# Patient Record
Sex: Female | Born: 1990 | Race: Black or African American | Hispanic: No | Marital: Single | Smoking: Never smoker
Health system: Southern US, Community
[De-identification: ages and names within clinical notes are randomized; demographics above are authoritative.]

## PROBLEM LIST (undated history)

## (undated) DIAGNOSIS — Z789 Other specified health status: Secondary | ICD-10-CM

## (undated) DIAGNOSIS — Z8759 Personal history of other complications of pregnancy, childbirth and the puerperium: Secondary | ICD-10-CM

## (undated) HISTORY — DX: Personal history of other complications of pregnancy, childbirth and the puerperium: Z87.59

## (undated) HISTORY — PX: NO PAST SURGERIES: SHX2092

---

## 2017-01-22 ENCOUNTER — Ambulatory Visit (INDEPENDENT_AMBULATORY_CARE_PROVIDER_SITE_OTHER): Payer: No Typology Code available for payment source | Admitting: Obstetrics & Gynecology

## 2017-01-22 ENCOUNTER — Encounter: Payer: Self-pay | Admitting: Obstetrics & Gynecology

## 2017-01-22 VITALS — BP 134/80 | HR 75 | Wt 130.6 lb

## 2017-01-22 DIAGNOSIS — Z113 Encounter for screening for infections with a predominantly sexual mode of transmission: Secondary | ICD-10-CM | POA: Diagnosis not present

## 2017-01-22 DIAGNOSIS — Z23 Encounter for immunization: Secondary | ICD-10-CM

## 2017-01-22 DIAGNOSIS — Z0000 Encounter for general adult medical examination without abnormal findings: Secondary | ICD-10-CM

## 2017-01-22 DIAGNOSIS — N938 Other specified abnormal uterine and vaginal bleeding: Secondary | ICD-10-CM

## 2017-01-22 DIAGNOSIS — Z124 Encounter for screening for malignant neoplasm of cervix: Secondary | ICD-10-CM | POA: Diagnosis not present

## 2017-01-22 NOTE — Progress Notes (Signed)
   Subjective:    Patient ID: Laurie Newman, female    DOB: 1991/03/23, 26 y.o.   MRN: [id number]  HPI 26 yo S AA P0 here today with the issue of DUB. She normally has a period monthly, lasting 6-7 days. However, she has been bleeding for the last 3 weeks.  She has been in a relationship for the past 3 years and didn't use contraception during that time.  She had sex with him about 1 month ago.    Review of Systems She had all 3 Gardasils in the past. 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    Objective:   Physical Exam Breathing, conversing, and ambulating normally Abd- benign Vagina, cervix- normal Bimanual exam- NSSA, NT, no adnexal masses or tenderness     Assessment & Plan:  Preventative care- flu vaccine, pap smear with cervical cultures DUB- check TSH, gyn u/s

## 2017-01-23 LAB — CYTOLOGY - PAP
Chlamydia: POSITIVE — AB
Diagnosis: NEGATIVE
NEISSERIA GONORRHEA: NEGATIVE
TRICH (WINDOWPATH): POSITIVE — AB

## 2017-01-25 ENCOUNTER — Telehealth: Payer: Self-pay

## 2017-01-25 ENCOUNTER — Ambulatory Visit (HOSPITAL_COMMUNITY)
Admission: RE | Admit: 2017-01-25 | Discharge: 2017-01-25 | Disposition: A | Discharge status: Home / Self Care | Payer: No Typology Code available for payment source | Source: Ambulatory Visit | Attending: Obstetrics & Gynecology | Admitting: Obstetrics & Gynecology

## 2017-01-25 ENCOUNTER — Inpatient Hospital Stay (HOSPITAL_COMMUNITY)
Admission: AD | Admit: 2017-01-25 | Discharge: 2017-01-25 | Discharge status: Against Medical Advice | Payer: No Typology Code available for payment source | Source: Ambulatory Visit | Attending: Obstetrics & Gynecology | Admitting: Obstetrics & Gynecology

## 2017-01-25 DIAGNOSIS — D259 Leiomyoma of uterus, unspecified: Secondary | ICD-10-CM | POA: Diagnosis not present

## 2017-01-25 DIAGNOSIS — N938 Other specified abnormal uterine and vaginal bleeding: Secondary | ICD-10-CM | POA: Diagnosis present

## 2017-01-25 MED ORDER — METRONIDAZOLE 500 MG PO TABS: 2000.0000 mg | ORAL_TABLET | Freq: Once | ORAL | 0 refills | Status: AC

## 2017-01-25 MED ORDER — AZITHROMYCIN 250 MG PO TABS: 1000.0000 mg | ORAL_TABLET | Freq: Once | ORAL | 0 refills | Status: AC

## 2017-01-25 NOTE — MAU Note (Signed)
Patient not in lobby. Left prior to triage AMA

## 2017-01-25 NOTE — MAU Note (Signed)
Patient not in lobby

## 2017-01-25 NOTE — Telephone Encounter (Signed)
Notified pt of + Trich and Chlamydia results and the need for Flagyl and Zithromax tx.  Rx e-prescribed per standing protocol.  I also informed pt to please inform her partner(s) as well. Pt stated understanding with no further questions.  STD Card faxed to [facility].

## 2017-01-25 NOTE — MAU Note (Signed)
Not in lobby

## 2017-02-27 ENCOUNTER — Ambulatory Visit: Payer: No Typology Code available for payment source | Admitting: Obstetrics and Gynecology

## 2017-02-27 ENCOUNTER — Encounter: Payer: Self-pay | Admitting: Obstetrics and Gynecology

## 2017-02-27 VITALS — BP 113/67 | HR 72 | Ht 63.0 in | Wt 131.2 lb

## 2017-02-27 DIAGNOSIS — Z09 Encounter for follow-up examination after completed treatment for conditions other than malignant neoplasm: Secondary | ICD-10-CM

## 2017-02-27 NOTE — Progress Notes (Signed)
Patient was diagnosed with STI 4 weeks ago. She took the recommended treatment. Her partner was treated and she has waited the recommended time frame to return to intercourse.  She was told she needed a test of cure. She is here in the office for a test of cure.  Discussed no need for test of cure. Patient is not pregnant. Offered to do [facility] today since the patient is here. Patient declined and was happy she did not have any more testing.    Noni Saupe I, NP 02/27/2017 2:50 PM

## 2018-03-03 DIAGNOSIS — R350 Frequency of micturition: Secondary | ICD-10-CM | POA: Diagnosis not present

## 2018-03-03 DIAGNOSIS — Z202 Contact with and (suspected) exposure to infections with a predominantly sexual mode of transmission: Secondary | ICD-10-CM | POA: Diagnosis not present

## 2018-03-03 DIAGNOSIS — N898 Other specified noninflammatory disorders of vagina: Secondary | ICD-10-CM | POA: Diagnosis not present

## 2019-03-05 ENCOUNTER — Ambulatory Visit: Payer: Self-pay

## 2019-03-10 ENCOUNTER — Other Ambulatory Visit: Payer: Self-pay

## 2019-03-10 ENCOUNTER — Ambulatory Visit (INDEPENDENT_AMBULATORY_CARE_PROVIDER_SITE_OTHER): Payer: Self-pay | Admitting: General Practice

## 2019-03-10 DIAGNOSIS — Z113 Encounter for screening for infections with a predominantly sexual mode of transmission: Secondary | ICD-10-CM

## 2019-03-10 DIAGNOSIS — Z8619 Personal history of other infectious and parasitic diseases: Secondary | ICD-10-CM

## 2019-03-10 DIAGNOSIS — Z3201 Encounter for pregnancy test, result positive: Secondary | ICD-10-CM

## 2019-03-10 LAB — POCT PREGNANCY, URINE: Preg Test, Ur: POSITIVE — AB

## 2019-03-10 NOTE — Progress Notes (Addendum)
Patient presents to office today for UPT. UPT +. Patient reports first positive home test 10/31.  LMP 01/21/19 EDD 10/28/19 [redacted]w[redacted]d. Patient states she takesTylenol PRN but no other meds/vitamins. Advised she start prenatal vitamins & OB care- recommended [facility] office as it is closer to her.  Patient verbalized understanding & requests STD testing today- states she has had a hx of infections in the past, most recently in July. Instructed patient in self swab & specimen collected. Discussed we will reach out to her with any + result/treatment needed & results will be available in mychart. Patient verbalized understanding & had no questions.  Koren Bound RN BSN 03/10/19    Chart reviewed for nurse visit. Agree with plan of care.   Virginia Rochester, NP 03/10/2019 1:09 PM

## 2019-03-11 LAB — CERVICOVAGINAL ANCILLARY ONLY
Chlamydia: NEGATIVE
Comment: NEGATIVE
Comment: NEGATIVE
Comment: NORMAL
Neisseria Gonorrhea: NEGATIVE
Trichomonas: NEGATIVE

## 2019-03-19 ENCOUNTER — Telehealth: Payer: Self-pay | Admitting: *Deleted

## 2019-03-19 DIAGNOSIS — O3680X0 Pregnancy with inconclusive fetal viability, not applicable or unspecified: Secondary | ICD-10-CM

## 2019-03-19 NOTE — Telephone Encounter (Addendum)
Received a Advertising account executive message from Samul Dada at [employer] . They state they saw Laurie Newman and she reported LMP 01/21/19 and had a positive pregnancy test and when they did ultrasound the baby measured on target at 8 weeks but they could not find a heatbeat.  They report they told her that our office would call her back with appointment. Also would like [place] to call them back with if we reached her and the appointment.  Ragan Duhon,RN  I discussed with Dr. Kennon Rounds and will follow protocol and schedule Korea 7 days. I called scheduling line to schedule and no appointment within 7 day time frame per protocol. First available is 12 days. I have called Korea techs to see if they can find anther appointment and left a message for them to call me back.  Vaughan Basta, RN

## 2019-03-19 NOTE — Telephone Encounter (Signed)
Per discussion with Korea techs and scheduling was given appointment for 03/27/19 at 10:00 for viability Korea.  I called Laurie Newman and informed her we got a referral from [facility] because they could not confirm [facility]. I explained we have scheduled [place] for 03/27/19 at 10:00 and then she will come to our office for the results and she may have to wait a bit until we get results from radiologist. I explained we are concerned could be  A miscarriage and that if she has bleeding like a period and /or severe pain to go to [facility] for evaluation for miscarriage or ectopic ; otherwise if she is not having any issues to keep [place] appt for 03/27/19. She also confirmed she is fine now, no pain or bleeding and voices understanding.  I called [facility] and notified them we got there message, have scheduled [place] 03/27/19 10:00 and notified patient.  Braedyn Kauk,RN

## 2019-03-27 ENCOUNTER — Telehealth: Payer: Self-pay | Admitting: *Deleted

## 2019-03-27 ENCOUNTER — Ambulatory Visit (HOSPITAL_COMMUNITY): Admission: RE | Admit: 2019-03-27 | Payer: Self-pay | Source: Ambulatory Visit

## 2019-03-27 ENCOUNTER — Ambulatory Visit: Payer: Self-pay

## 2019-03-27 NOTE — Telephone Encounter (Signed)
Notified by MFM Hinton Dyer changed her viability Korea appointment from 03/27/19 to 04/30/19. Will forward to provider.  Vicenta Olds,RN

## 2019-04-12 ENCOUNTER — Inpatient Hospital Stay (HOSPITAL_COMMUNITY): Payer: Self-pay

## 2019-04-12 ENCOUNTER — Encounter (HOSPITAL_COMMUNITY): Payer: Self-pay | Admitting: Obstetrics and Gynecology

## 2019-04-12 ENCOUNTER — Inpatient Hospital Stay (HOSPITAL_COMMUNITY)
Admission: AD | Admit: 2019-04-12 | Discharge: 2019-04-12 | Disposition: A | Discharge status: Home / Self Care | Payer: Self-pay | Attending: Obstetrics and Gynecology | Admitting: Obstetrics and Gynecology

## 2019-04-12 ENCOUNTER — Other Ambulatory Visit: Payer: Self-pay

## 2019-04-12 DIAGNOSIS — N939 Abnormal uterine and vaginal bleeding, unspecified: Secondary | ICD-10-CM

## 2019-04-12 DIAGNOSIS — O039 Complete or unspecified spontaneous abortion without complication: Secondary | ICD-10-CM | POA: Insufficient documentation

## 2019-04-12 HISTORY — DX: Other specified health status: Z78.9

## 2019-04-12 LAB — CBC
HCT: 39.2 % (ref 36.0–46.0)
Hemoglobin: 13.7 g/dL (ref 12.0–15.0)
MCH: 31.7 pg (ref 26.0–34.0)
MCHC: 34.9 g/dL (ref 30.0–36.0)
MCV: 90.7 fL (ref 80.0–100.0)
Platelets: 325 10*3/uL (ref 150–400)
RBC: 4.32 MIL/uL (ref 3.87–5.11)
RDW: 12.2 % (ref 11.5–15.5)
WBC: 5.8 10*3/uL (ref 4.0–10.5)
nRBC: 0 % (ref 0.0–0.2)

## 2019-04-12 LAB — URINALYSIS, ROUTINE W REFLEX MICROSCOPIC
Bilirubin Urine: NEGATIVE
Glucose, UA: NEGATIVE mg/dL
Hgb urine dipstick: NEGATIVE
Ketones, ur: NEGATIVE mg/dL
Leukocytes,Ua: NEGATIVE
Nitrite: NEGATIVE
Protein, ur: NEGATIVE mg/dL
Specific Gravity, Urine: 1.018 (ref 1.005–1.030)
pH: 6 (ref 5.0–8.0)

## 2019-04-12 LAB — WET PREP, GENITAL
Clue Cells Wet Prep HPF POC: NONE SEEN
Sperm: NONE SEEN
Trich, Wet Prep: NONE SEEN
Yeast Wet Prep HPF POC: NONE SEEN

## 2019-04-12 LAB — ABO/RH: ABO/RH(D): B POS

## 2019-04-12 MED ORDER — MISOPROSTOL 200 MCG PO TABS: 800.0000 ug | ORAL_TABLET | Freq: Four times a day (QID) | ORAL | 0 refills | Status: DC

## 2019-04-12 MED ORDER — ONDANSETRON 8 MG PO TBDP: 8.0000 mg | ORAL_TABLET | Freq: Three times a day (TID) | ORAL | 0 refills | Status: DC | PRN

## 2019-04-12 MED ORDER — IBUPROFEN 400 MG PO TABS: 400.0000 mg | ORAL_TABLET | Freq: Four times a day (QID) | ORAL | 0 refills | Status: DC | PRN

## 2019-04-12 MED ORDER — ACETAMINOPHEN-CODEINE #3 300-30 MG PO TABS: 1.0000 | ORAL_TABLET | ORAL | 0 refills | Status: DC | PRN

## 2019-04-12 MED ORDER — MISOPROSTOL 200 MCG PO TABS
800.0000 ug | ORAL_TABLET | Freq: Once | ORAL | Status: AC
  Administered 2019-04-12: 800 ug via ORAL
  Filled 2019-04-12: qty 4

## 2019-04-12 NOTE — MAU Provider Note (Addendum)
Patient Laurie Newman is a 28 y.o. G1P0000  at [redacted]w[redacted]d here with complaints of vaginal bleeding last night and this morning. She denies constipation, vomiting, nausea, fever, SOB, dysuria or other ob-gyn complaints.   She had an Korea at 8 weeks at pregnancy care center on 11/23, but they were unable to confirm viable pregnancy. She was scheduled for follow up US on 12/3 at [facility] but she canceled the appointment and rescheduled it for early January. History     CSN: [id number]  Arrival date and time: 04/12/19 1028   None     Chief Complaint  Patient presents with  . Abdominal Pain  . Vaginal Bleeding   Vaginal Bleeding The patient's primary symptoms include vaginal bleeding. This is a new problem. The current episode started yesterday. The problem occurs intermittently. The problem has been unchanged. The patient is experiencing no pain. She is pregnant. Pertinent negatives include no abdominal pain, constipation, diarrhea or urgency. The vaginal discharge was bloody and mucoid. The vaginal bleeding is spotting. She has not been passing clots. She has not been passing tissue.  She noticed some mixed pink and brown blood on her underwear last night and this morning. It is not brisk bleeding; she notices it when she wipes.   OB History    Gravida  1   Para  0   Term  0   Preterm  0   AB  0   Living  0     SAB  0   TAB  0   Ectopic  0   Multiple  0   Live Births  0           Past Medical History:  Diagnosis Date  . Medical history non-contributory     Past Surgical History:  Procedure Laterality Date  . NO PAST SURGERIES      History reviewed. No pertinent family history.  Social History   Tobacco Use  . Smoking status: Never Smoker  . Smokeless tobacco: Never Used  Substance Use Topics  . Alcohol use: Not Currently  . Drug use: Not Currently    Allergies: No Known Allergies  Medications Prior to Admission  Medication Sig Dispense Refill Last Dose   . naproxen sodium (ANAPROX) 220 MG tablet Take 220 mg by mouth as needed.       Review of Systems  Constitutional: Negative.   HENT: Negative.   Gastrointestinal: Negative for abdominal pain, constipation and diarrhea.  Genitourinary: Positive for vaginal bleeding. Negative for urgency.  Neurological: Negative.   Hematological: Negative.    Physical Exam   Blood pressure 125/88, pulse 91, temperature 98.3 F (36.8 C), temperature source Oral, resp. rate 16, height 5\' 3"  (1.6 m), weight 64 kg, last menstrual period 01/21/2019, SpO2 100 %.  Physical Exam  Constitutional: She appears well-developed.  HENT:  Head: Normocephalic.  Respiratory: Effort normal.  GI: Soft.  Genitourinary:    Vagina normal.     Genitourinary Comments: NEFG; trace amounts of brown blood in the vagina, no clots or tissues. No lesions on vaginal walls or cervix.  Cervix is closed, long and thick, no CMT, suprapubic or adnexal tenderness.    Musculoskeletal:        General: Normal range of motion.     Cervical back: Normal range of motion.  Neurological: She is alert.  Skin: Skin is warm.  Psychiatric: She has a normal mood and affect.    MAU Course  Procedures  MDM -Korea for viability  shows embryo at 8 weeks with no cardiac activity; definitive for failed pregnancy  -Blood Type is B pos -CBC is 13.7 -wet prep negative  -GC CT pending     Early Intrauterine Pregnancy Failure Protocol X  Documented intrauterine pregnancy failure less than or equal to [redacted] weeks   gestation  X  No serious current illness  X  Baseline Hgb greater than or equal to 10g/dl  X  Patient has easily accessible transportation to the hospital  X  Clear preference  X  Practitioner/physician deems patient reliable  X  Counseling by practitioner or physician  X  Patient education by RN  X  Consent form signed       Rho-Gam given by RN if indicated  X  Medication dispensed  X  Cytotec 800 mcg Buccally by RN at home        Intravaginally by NP in MAU       Rectally by patient at home       Rectally by RN in MAU  X   Ibuprofen 600 mg 1 tablet by mouth every 6 hours as needed #30 - prescribed  X   Tylenol #3 mg by mouth every 4 to 6 hours as needed - prescribed  X   Zofran 8 mg by mouth every  as needed for nausea - prescribed  Reviewed with pt cytotec procedure.  Pt verbalizes that she lives close to the hospital and has transportation readily available.  Pt appears reliable and verbalizes understanding and agrees with plan of care  [place] images reviewed by me.   Assessment and Plan   1. Miscarriage   2. Vaginal bleeding     -Patient tolerated cytotec buccally well -Rx for pain and anti-nausea medicine given -message sent to clinic to change patient's appt on the 6th to a follow up from SAB.  -Reviewed strict bleeding and infections precautions and when to return to MAU.  -patient to take second dose of cytotec if she does not start bleeding within 72 hours.  -Work note given for patient to return to work on Thursday [employer] 04/12/2019, 11:34 AM

## 2019-04-12 NOTE — MAU Note (Signed)
Laurie Newman is a 28 y.o. at [redacted]w[redacted]d here in MAU reporting: [facility] around 8 weeks and they did an u/s but did not see any cardiac activity. Last night started having some pink spotting, only saw it on the toilet paper. This AM saw some brown bleeding, still only on the toilet paper after she wiped but it was more then the previous pink bleeding. Having some lower abdominal discomfort.  Onset of complaint: yesterday  Pain score: 2/10  Vitals:   04/12/19 1044  BP: 125/88  Pulse: 91  Resp: 16  Temp: 98.3 F (36.8 C)  SpO2: 100%     Lab orders placed from triage: UA

## 2019-04-12 NOTE — Discharge Instructions (Signed)
Miscarriage A miscarriage is the loss of an unborn baby (fetus) before the 20th week of pregnancy. Follow these instructions at home: Medicines   Take over-the-counter and prescription medicines only as told by your doctor.  If you were prescribed antibiotic medicine, take it as told by your doctor. Do not stop taking the antibiotic even if you start to feel better.  Do not take NSAIDs unless your doctor says that this is safe for you. NSAIDs include aspirin and ibuprofen. These medicines can cause bleeding. Activity  Rest as directed. Ask your doctor what activities are safe for you.  Have someone help you at home during this time. General instructions  Write down how many pads you use each day and how soaked they are.  Watch the amount of tissue or clumps of blood (blood clots) that you pass from your vagina. Save any large amounts of tissue for your doctor.  Do not use tampons, douche, or have sex until your doctor approves.  To help you and your partner with the process of grieving, talk with your doctor or seek counseling.  When you are ready, meet with your doctor to talk about steps you should take for your health. Also, talk with your doctor about steps to take to have a healthy pregnancy in the future.  Keep all follow-up visits as told by your doctor. This is important. Contact a doctor if:  You have a fever or chills.  You have vaginal discharge that smells bad.  You have more bleeding. Get help right away if:  You have very bad cramps or pain in your back or belly.  You pass clumps of blood that are walnut-sized or larger from your vagina.  You pass tissue that is walnut-sized or larger from your vagina.  You soak more than 1 regular pad in an hour.  You get light-headed or weak.  You faint (pass out).  You have feelings of sadness that do not go away, or you have thoughts of hurting yourself. Summary  A miscarriage is the loss of an unborn baby before  the 20th week of pregnancy.  Follow your doctor's instructions for home care. Keep all follow-up appointments.  To help you and your partner with the process of grieving, talk with your doctor or seek counseling. This information is not intended to replace advice given to you by your health care provider. Make sure you discuss any questions you have with your health care provider. Document Released: 07/03/2011 Document Revised: 08/02/2018 Document Reviewed: 05/16/2016 Elsevier Patient Education  2020 [employer].

## 2019-04-14 LAB — GC/CHLAMYDIA PROBE AMP (~~LOC~~) NOT AT ARMC
Chlamydia: NEGATIVE
Comment: NEGATIVE
Comment: NORMAL
Neisseria Gonorrhea: NEGATIVE

## 2019-04-15 ENCOUNTER — Other Ambulatory Visit: Payer: Self-pay

## 2019-04-30 ENCOUNTER — Other Ambulatory Visit: Payer: Self-pay

## 2019-04-30 ENCOUNTER — Encounter: Payer: Self-pay | Admitting: Medical

## 2019-04-30 ENCOUNTER — Ambulatory Visit (INDEPENDENT_AMBULATORY_CARE_PROVIDER_SITE_OTHER): Payer: BC Managed Care – PPO | Admitting: Medical

## 2019-04-30 ENCOUNTER — Other Ambulatory Visit (HOSPITAL_COMMUNITY): Payer: Self-pay

## 2019-04-30 ENCOUNTER — Ambulatory Visit: Payer: Self-pay

## 2019-04-30 VITALS — BP 124/83 | HR 98 | Ht 63.0 in | Wt 144.1 lb

## 2019-04-30 DIAGNOSIS — O039 Complete or unspecified spontaneous abortion without complication: Secondary | ICD-10-CM | POA: Diagnosis not present

## 2019-04-30 NOTE — Patient Instructions (Signed)
Miscarriage A miscarriage is the loss of an unborn baby (fetus) before the 20th week of pregnancy. Follow these instructions at home: Medicines   Take over-the-counter and prescription medicines only as told by your doctor.  If you were prescribed antibiotic medicine, take it as told by your doctor. Do not stop taking the antibiotic even if you start to feel better.  Do not take NSAIDs unless your doctor says that this is safe for you. NSAIDs include aspirin and ibuprofen. These medicines can cause bleeding. Activity  Rest as directed. Ask your doctor what activities are safe for you.  Have someone help you at home during this time. General instructions  Write down how many pads you use each day and how soaked they are.  Watch the amount of tissue or clumps of blood (blood clots) that you pass from your vagina. Save any large amounts of tissue for your doctor.  Do not use tampons, douche, or have sex until your doctor approves.  To help you and your partner with the process of grieving, talk with your doctor or seek counseling.  When you are ready, meet with your doctor to talk about steps you should take for your health. Also, talk with your doctor about steps to take to have a healthy pregnancy in the future.  Keep all follow-up visits as told by your doctor. This is important. Contact a doctor if:  You have a fever or chills.  You have vaginal discharge that smells bad.  You have more bleeding. Get help right away if:  You have very bad cramps or pain in your back or belly.  You pass clumps of blood that are walnut-sized or larger from your vagina.  You pass tissue that is walnut-sized or larger from your vagina.  You soak more than 1 regular pad in an hour.  You get light-headed or weak.  You faint (pass out).  You have feelings of sadness that do not go away, or you have thoughts of hurting yourself. Summary  A miscarriage is the loss of an unborn baby before  the 20th week of pregnancy.  Follow your doctor's instructions for home care. Keep all follow-up appointments.  To help you and your partner with the process of grieving, talk with your doctor or seek counseling. This information is not intended to replace advice given to you by your health care provider. Make sure you discuss any questions you have with your health care provider. Document Revised: 08/02/2018 Document Reviewed: 05/16/2016 Elsevier Patient Education  2020 Elsevier Inc.  

## 2019-04-30 NOTE — Progress Notes (Signed)
cytotec 12/19 Bled x 2 weeks No bleeding  No pain  No fever   Cbc hcg   History:  [name] is a 29 y.o. G1P0010 who presents to clinic today for SAB follow-up. The patient was seen in the MAU on 12/19 and diagnosed with missed AB at ~ [redacted] weeks GA. She was given Cytotec. She had bleeding for ~ 2 weeks. She denies bleeding, pain or fever today.    The following portions of the patient's history were reviewed and updated as appropriate: allergies, current medications, family history, past medical history, social history, past surgical history and problem list.  Review of Systems:  Review of Systems  Constitutional: Negative for fever and malaise/fatigue.  Gastrointestinal: Negative for abdominal pain.  Genitourinary: Negative for dysuria, frequency and urgency.       Neg - vaginal bleeding      Objective:  Physical Exam BP 124/83   Pulse 98   Ht 5\' 3"  (1.6 m)   Wt 144 lb 1.6 oz (65.4 kg)   LMP 01/21/2019 Comment: SAB  Breastfeeding Unknown   BMI 25.53 kg/m  Physical Exam  Nursing note and vitals reviewed. Constitutional: She is oriented to person, place, and time. She appears well-developed and well-nourished. No distress.  HENT:  Head: Normocephalic and atraumatic.  Cardiovascular: Normal rate.  Respiratory: Effort normal.  GI: Soft. She exhibits no distension.  Neurological: She is alert and oriented to person, place, and time.  Skin: Skin is warm and dry. No erythema.  Psychiatric: She has a normal mood and affect.    Assessment & Plan:  1. Miscarriage - CBC - Beta hCG quant (ref lab) - Patient desired pregnancy. Discussed appropriate waiting period for attempting to conceive.  - Patient will be contacted with results through [facility]  Luvenia Redden, PA-C 04/30/2019 4:21 PM

## 2019-05-01 LAB — CBC
Hematocrit: 42.6 % (ref 34.0–46.6)
Hemoglobin: 14.4 g/dL (ref 11.1–15.9)
MCH: 31.1 pg (ref 26.6–33.0)
MCHC: 33.8 g/dL (ref 31.5–35.7)
MCV: 92 fL (ref 79–97)
Platelets: 409 10*3/uL (ref 150–450)
RBC: 4.63 x10E6/uL (ref 3.77–5.28)
RDW: 12.2 % (ref 11.7–15.4)
WBC: 6 10*3/uL (ref 3.4–10.8)

## 2019-05-01 LAB — BETA HCG QUANT (REF LAB): hCG Quant: 25 m[IU]/mL

## 2019-05-08 ENCOUNTER — Other Ambulatory Visit: Payer: BC Managed Care – PPO

## 2019-07-09 DIAGNOSIS — O99211 Obesity complicating pregnancy, first trimester: Secondary | ICD-10-CM | POA: Diagnosis not present

## 2019-07-09 DIAGNOSIS — Z113 Encounter for screening for infections with a predominantly sexual mode of transmission: Secondary | ICD-10-CM | POA: Diagnosis not present

## 2019-07-09 DIAGNOSIS — D251 Intramural leiomyoma of uterus: Secondary | ICD-10-CM | POA: Diagnosis not present

## 2019-07-09 DIAGNOSIS — Z3687 Encounter for antenatal screening for uncertain dates: Secondary | ICD-10-CM | POA: Diagnosis not present

## 2019-07-09 DIAGNOSIS — O468X1 Other antepartum hemorrhage, first trimester: Secondary | ICD-10-CM | POA: Diagnosis not present

## 2019-07-09 DIAGNOSIS — O3680X0 Pregnancy with inconclusive fetal viability, not applicable or unspecified: Secondary | ICD-10-CM | POA: Diagnosis not present

## 2019-07-09 DIAGNOSIS — Z3401 Encounter for supervision of normal first pregnancy, first trimester: Secondary | ICD-10-CM | POA: Diagnosis not present

## 2019-07-21 DIAGNOSIS — O3680X0 Pregnancy with inconclusive fetal viability, not applicable or unspecified: Secondary | ICD-10-CM | POA: Diagnosis not present

## 2019-07-21 DIAGNOSIS — O468X1 Other antepartum hemorrhage, first trimester: Secondary | ICD-10-CM | POA: Diagnosis not present

## 2019-07-21 DIAGNOSIS — O209 Hemorrhage in early pregnancy, unspecified: Secondary | ICD-10-CM | POA: Diagnosis not present

## 2019-07-21 DIAGNOSIS — Z3A10 10 weeks gestation of pregnancy: Secondary | ICD-10-CM | POA: Diagnosis not present

## 2019-08-08 DIAGNOSIS — O468X1 Other antepartum hemorrhage, first trimester: Secondary | ICD-10-CM | POA: Diagnosis not present

## 2019-08-08 DIAGNOSIS — Z1379 Encounter for other screening for genetic and chromosomal anomalies: Secondary | ICD-10-CM | POA: Diagnosis not present

## 2019-08-08 DIAGNOSIS — Z36 Encounter for antenatal screening for chromosomal anomalies: Secondary | ICD-10-CM | POA: Diagnosis not present

## 2019-08-08 DIAGNOSIS — Z3682 Encounter for antenatal screening for nuchal translucency: Secondary | ICD-10-CM | POA: Diagnosis not present

## 2019-08-08 DIAGNOSIS — D259 Leiomyoma of uterus, unspecified: Secondary | ICD-10-CM | POA: Diagnosis not present

## 2019-08-08 DIAGNOSIS — Z3A13 13 weeks gestation of pregnancy: Secondary | ICD-10-CM | POA: Diagnosis not present

## 2019-09-24 DIAGNOSIS — O99212 Obesity complicating pregnancy, second trimester: Secondary | ICD-10-CM | POA: Diagnosis not present

## 2019-09-24 DIAGNOSIS — Z3686 Encounter for antenatal screening for cervical length: Secondary | ICD-10-CM | POA: Diagnosis not present

## 2019-09-24 DIAGNOSIS — Z3A20 20 weeks gestation of pregnancy: Secondary | ICD-10-CM | POA: Diagnosis not present

## 2019-10-10 DIAGNOSIS — Z362 Encounter for other antenatal screening follow-up: Secondary | ICD-10-CM | POA: Diagnosis not present

## 2019-10-10 DIAGNOSIS — O99212 Obesity complicating pregnancy, second trimester: Secondary | ICD-10-CM | POA: Diagnosis not present

## 2019-10-10 DIAGNOSIS — Z3A22 22 weeks gestation of pregnancy: Secondary | ICD-10-CM | POA: Diagnosis not present

## 2021-08-10 IMAGING — US US OB COMP LESS 14 WK
1 series · 15 of 28 positions shown · non-contrast
Comparison: None of this gestation

CLINICAL DATA: Vaginal bleeding and first-trimester pregnancy

EXAM:
OBSTETRIC <14 WK ULTRASOUND
TECHNIQUE: Transabdominal ultrasound was performed for evaluation of the
gestation as well as the maternal uterus and adnexal regions.

[Series 1: us ob comp less 14 wk · 15 of 46 slices shown]
[im 1/46]
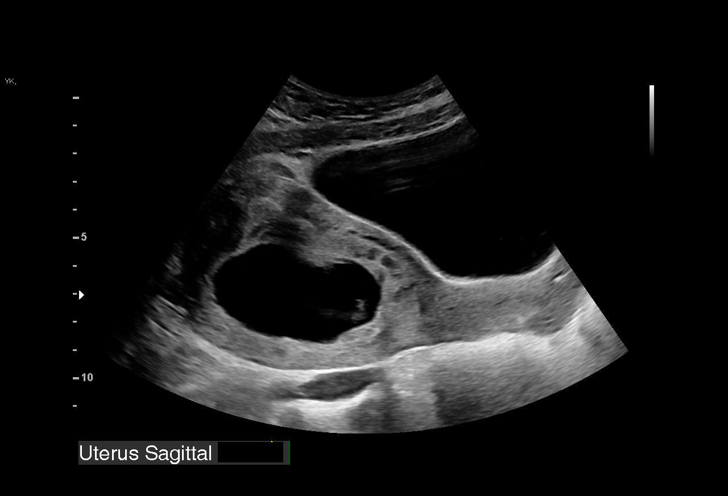
[im 4/46]
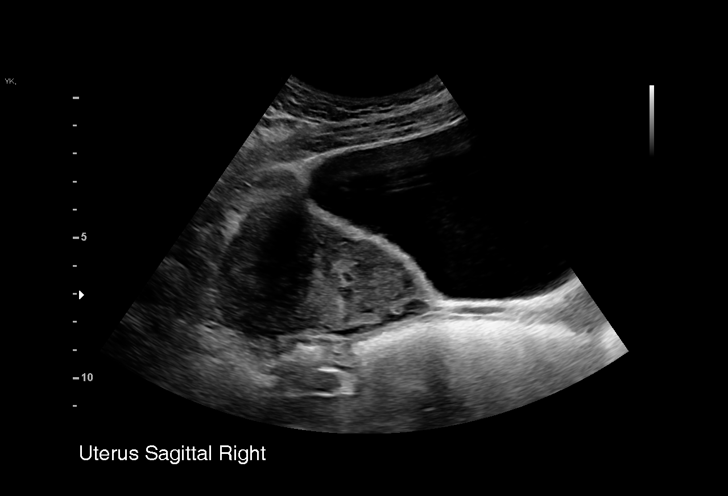
[im 7/46]
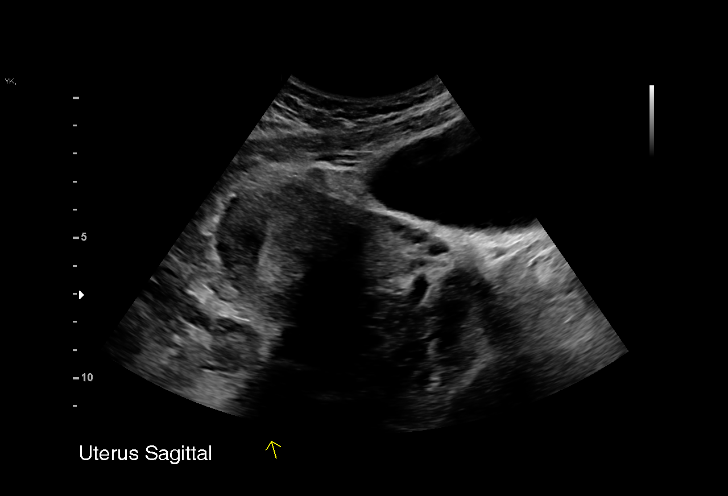
[im 11/46]
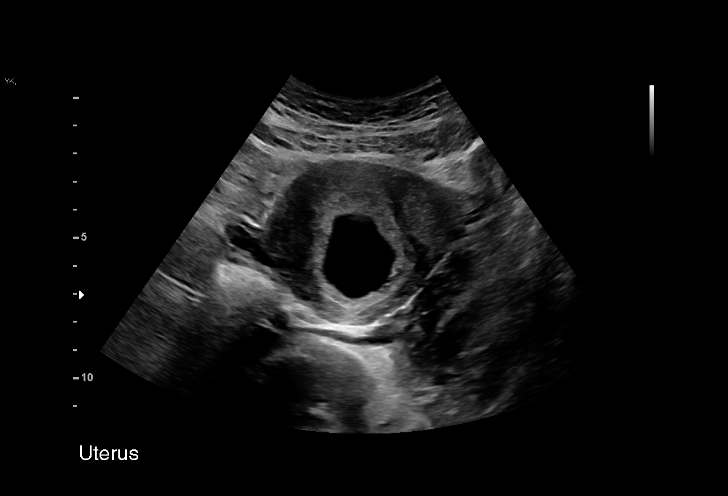
[im 14/46]
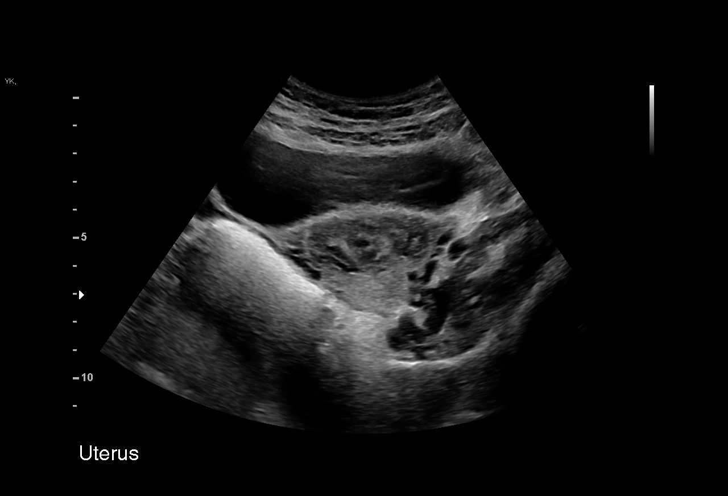
[im 17/46]
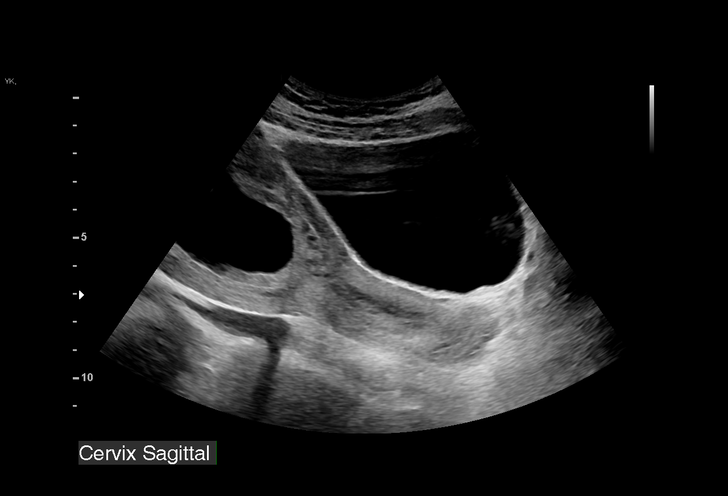
[im 21/46]
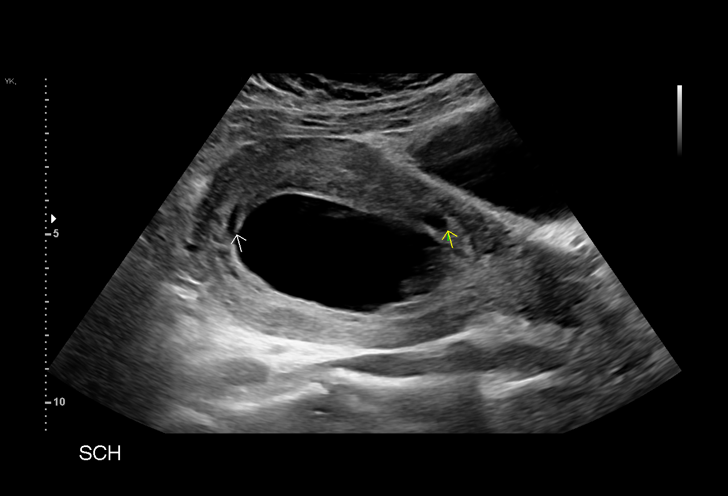
[im 24/46]
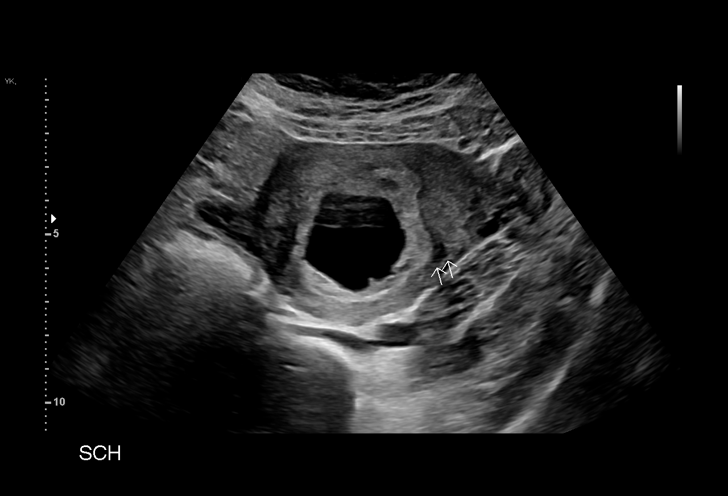
[im 26/46]
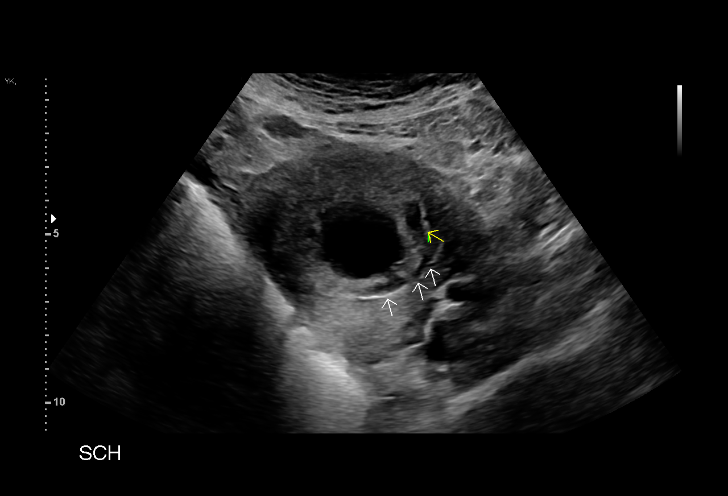
[im 29/46]
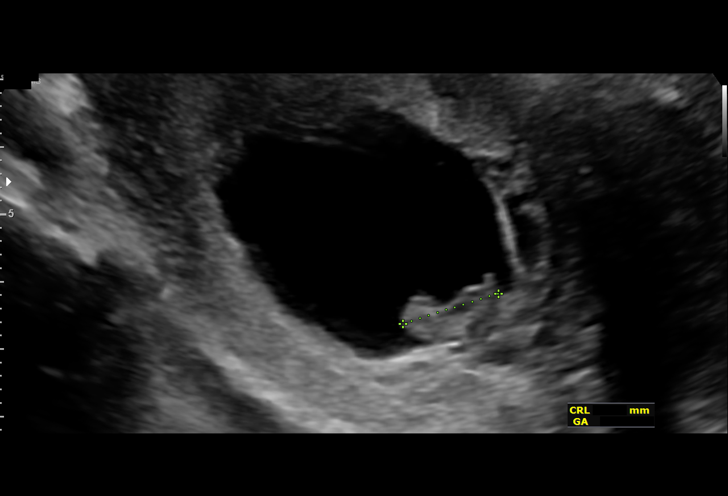
[im 32/46]
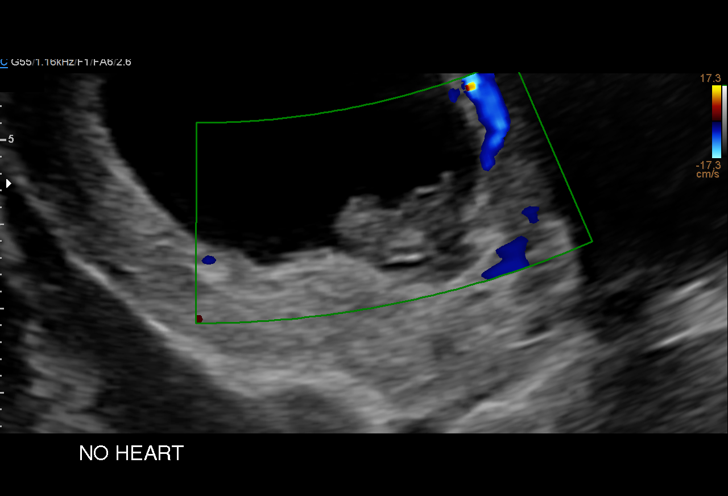
[im 36/46]
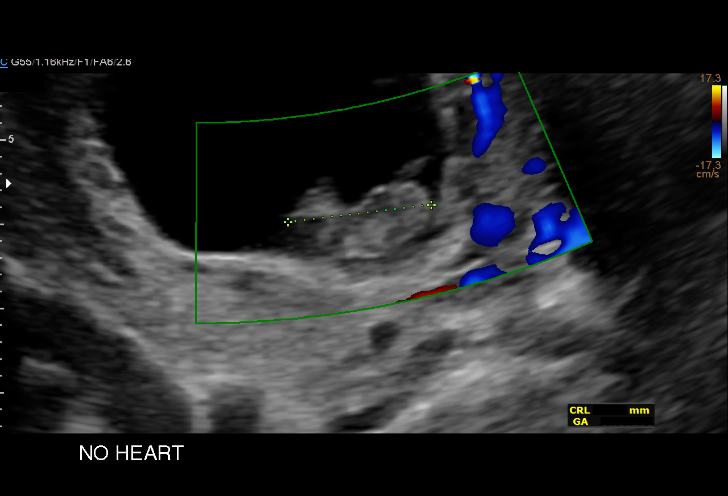
[im 39/46]
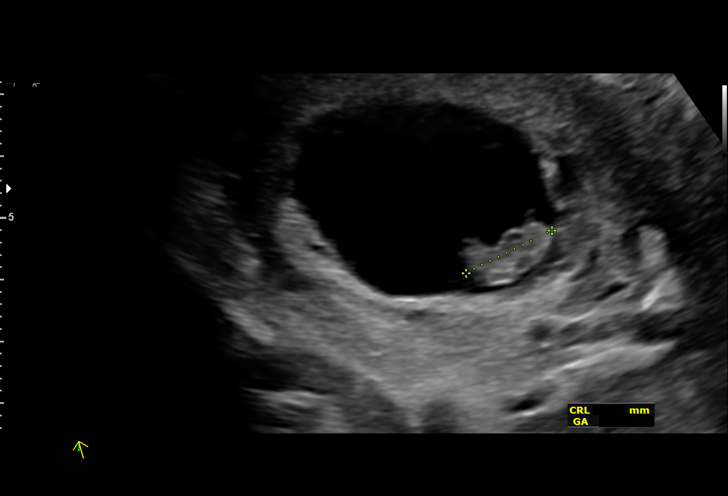
[im 42/46]
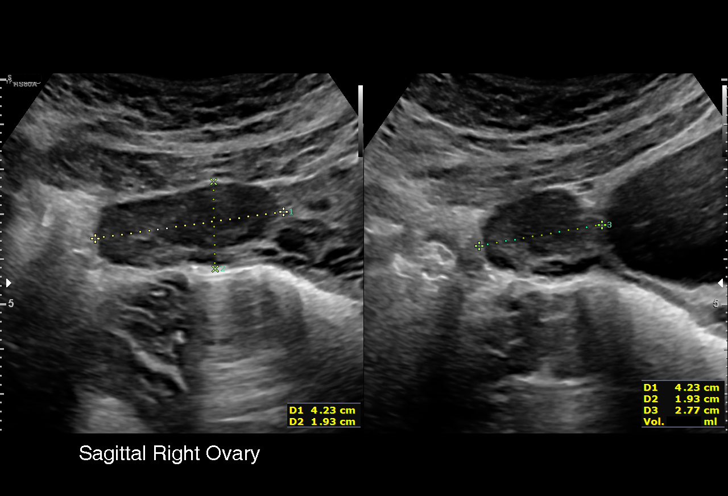
[im 46/46]
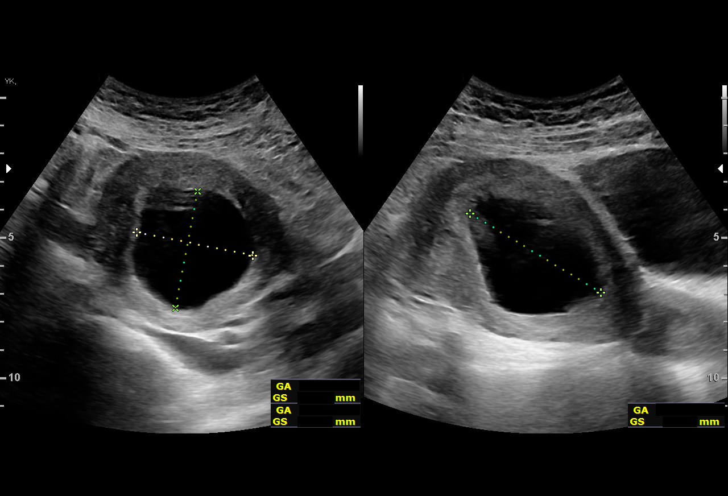

[15 of 28 positions shown; findings below may reference images not displayed]

FINDINGS: Intrauterine gestational sac: Present and disproportionately large
relative to the crown-rump length

Yolk sac:  Not seen

Embryo:  Present

Cardiac Activity: Not seen

CRL:   16.3 mm   8 w 0 d

Subchorionic hemorrhage: Small crescentic pockets of hemorrhage are
seen at multiple locations along the gestational sac.

Maternal uterus/adnexae: Small fundal fibroid on a 4625 comparison
which was re-identified, 2.6 cm.
IMPRESSION: 1. Single intrauterine pregnancy with crown lump length of 16.3 mm
and no cardiac activity. Findings meet definitive criteria for
failed pregnancy. This follows SRU consensus guidelines: Diagnostic
Criteria for Nonviable Pregnancy Early in the First Trimester. N
Engl J Med 3300;[DATE].
2. Multiple small pockets of subchorionic hemorrhage around the
gestational sac.

## 2023-09-04 ENCOUNTER — Other Ambulatory Visit (HOSPITAL_COMMUNITY)
Admission: RE | Admit: 2023-09-04 | Discharge: 2023-09-04 | Disposition: A | Discharge status: Home / Self Care | Source: Ambulatory Visit | Attending: Obstetrics & Gynecology | Admitting: Obstetrics & Gynecology

## 2023-09-04 ENCOUNTER — Encounter (HOSPITAL_BASED_OUTPATIENT_CLINIC_OR_DEPARTMENT_OTHER): Payer: Self-pay

## 2023-09-04 ENCOUNTER — Ambulatory Visit (HOSPITAL_BASED_OUTPATIENT_CLINIC_OR_DEPARTMENT_OTHER)

## 2023-09-04 VITALS — BP 113/72 | HR 97 | Ht 63.0 in | Wt 216.0 lb

## 2023-09-04 DIAGNOSIS — Z6838 Body mass index (BMI) 38.0-38.9, adult: Secondary | ICD-10-CM

## 2023-09-04 DIAGNOSIS — Z3481 Encounter for supervision of other normal pregnancy, first trimester: Secondary | ICD-10-CM | POA: Diagnosis present

## 2023-09-04 DIAGNOSIS — Z3A11 11 weeks gestation of pregnancy: Secondary | ICD-10-CM | POA: Diagnosis not present

## 2023-09-04 DIAGNOSIS — O0993 Supervision of high risk pregnancy, unspecified, third trimester: Secondary | ICD-10-CM | POA: Insufficient documentation

## 2023-09-04 DIAGNOSIS — Z3480 Encounter for supervision of other normal pregnancy, unspecified trimester: Secondary | ICD-10-CM | POA: Insufficient documentation

## 2023-09-04 LAB — HEPATITIS C ANTIBODY: HCV Ab: NEGATIVE

## 2023-09-04 MED ORDER — GOJJI WEIGHT SCALE MISC: 1.0000 | Freq: Once | 0 refills | Status: AC

## 2023-09-04 MED ORDER — BLOOD PRESSURE KIT DEVI: 1.0000 | Freq: Once | 0 refills | Status: AC

## 2023-09-04 NOTE — Progress Notes (Unsigned)
 New OB Intake  I explained I am completing New OB Intake today. We discussed EDD of 03/23/2024, by Last Menstrual Period. Pt is G4P1021. I reviewed her allergies, medications and Medical/Surgical/OB history.    There are no active problems to display for this patient.    Concerns addressed today  Delivery Plans Plans to deliver at [facility]. Discussed the nature of our practice with multiple providers including residents and students. Due to the size of the practice, the delivering provider may not be the same as those providing prenatal care.   Patient is interested in water birth.  MyChart/Babyscripts MyChart access verified. I explained pt will have some visits in office and some virtually. Babyscripts instructions given and order placed. Patient verifies receipt of registration text/e-mail.   Blood Pressure Cuff/Weight Scale Blood pressure cuff ordered for patient to pick-up from [employer]. Explained after first prenatal appt pt will check weekly and document in Babyscripts. Patient does not have weight scale; order sent to [facility], patient may track weight weekly in Babyscripts.  Anatomy US  Explained first scheduled US  will be around 19 weeks. Anatomy US  scheduled for *** at ***.  Is patient a CenteringPregnancy candidate?  Declined Declined due to Schedule    Is patient a Mom+Baby Combined Care candidate?  Not a candidate   If accepted, confirm patient does not intend to move from the area for at least 12 months, then notify Mom+Baby staff    First visit review I reviewed new OB appt with patient. Explained pt will be seen by Dr.Miller at first visit. Discussed [facility] [place] genetic screening with patient. Pt declines both Panorama and Horizon.. Routine prenatal labs collected   Last Pap Diagnosis  Date Value Ref Range Status  01/22/2017   Final   NEGATIVE FOR INTRAEPITHELIAL LESIONS OR MALIGNANCY.  01/22/2017 TRICHOMONAS VAGINALIS PRESENT.  Final    Marliss Simple, RN 09/04/2023  3:30 PM

## 2023-09-05 DIAGNOSIS — Z6838 Body mass index (BMI) 38.0-38.9, adult: Secondary | ICD-10-CM | POA: Insufficient documentation

## 2023-09-06 LAB — CBC
Hematocrit: 40.9 % (ref 34.0–46.6)
Hemoglobin: 13.8 g/dL (ref 11.1–15.9)
MCH: 30.9 pg (ref 26.6–33.0)
MCHC: 33.7 g/dL (ref 31.5–35.7)
MCV: 92 fL (ref 79–97)
Platelets: 329 10*3/uL (ref 150–450)
RBC: 4.47 x10E6/uL (ref 3.77–5.28)
RDW: 11.7 % (ref 11.7–15.4)
WBC: 8.7 10*3/uL (ref 3.4–10.8)

## 2023-09-06 LAB — CERVICOVAGINAL ANCILLARY ONLY
Chlamydia: NEGATIVE
Comment: NEGATIVE
Comment: NORMAL
Neisseria Gonorrhea: NEGATIVE

## 2023-09-06 LAB — HEMOGLOBIN A1C
Est. average glucose Bld gHb Est-mCnc: 105 mg/dL
Hgb A1c MFr Bld: 5.3 % (ref 4.8–5.6)

## 2023-09-06 LAB — HIV ANTIBODY (ROUTINE TESTING W REFLEX): HIV Screen 4th Generation wRfx: NONREACTIVE

## 2023-09-06 LAB — ABO/RH: Rh Factor: POSITIVE

## 2023-09-06 LAB — HEPATITIS C ANTIBODY: Hep C Virus Ab: NONREACTIVE

## 2023-09-06 LAB — HEPATITIS B SURFACE ANTIGEN: Hepatitis B Surface Ag: NEGATIVE

## 2023-09-06 LAB — ANTIBODY SCREEN: Antibody Screen: NEGATIVE

## 2023-09-06 LAB — RPR: RPR Ser Ql: NONREACTIVE

## 2023-09-06 LAB — RUBELLA SCREEN: Rubella Antibodies, IGG: 3.88 {index} (ref >0.99)

## 2023-09-07 LAB — URINE CULTURE

## 2023-09-11 ENCOUNTER — Ambulatory Visit (INDEPENDENT_AMBULATORY_CARE_PROVIDER_SITE_OTHER): Admitting: Certified Nurse Midwife

## 2023-09-11 ENCOUNTER — Encounter (HOSPITAL_BASED_OUTPATIENT_CLINIC_OR_DEPARTMENT_OTHER): Payer: Self-pay | Admitting: Certified Nurse Midwife

## 2023-09-11 VITALS — BP 124/62 | HR 100 | Wt 216.8 lb

## 2023-09-11 DIAGNOSIS — Z3481 Encounter for supervision of other normal pregnancy, first trimester: Secondary | ICD-10-CM

## 2023-09-11 DIAGNOSIS — Z3480 Encounter for supervision of other normal pregnancy, unspecified trimester: Secondary | ICD-10-CM

## 2023-09-11 DIAGNOSIS — Z3A12 12 weeks gestation of pregnancy: Secondary | ICD-10-CM

## 2023-09-11 MED ORDER — ASPIRIN 81 MG PO TBEC: 81.0000 mg | DELAYED_RELEASE_TABLET | Freq: Every day | ORAL | 3 refills | Status: DC

## 2023-09-11 NOTE — Progress Notes (Signed)
  Pt desired Panorama screening for Trisomy 13, 18,21 (collected). Desires Gender.     PRENATAL VISIT NOTE  Subjective:  Laurie Newman is a 33 y.o. [phone] at [redacted]w[redacted]d being seen today for ongoing prenatal care.  She is currently monitored for the following issues for this  pregnancy and has Supervision of other normal pregnancy, antepartum and BMI 38.0-38.9,adult on their problem list.  Patient reports no bleeding, no contractions, no cramping, and no leaking.  Contractions: Not present. Vag. Bleeding: None.   . Denies leaking of fluid.   The following portions of the patient's history were reviewed and updated as appropriate: allergies, current medications, past family history, past medical history, past social history, past surgical history and problem list.   Objective:    Vitals:   09/11/23 1618  BP: 124/62  Pulse: 100  Weight: 216 lb 12.8 oz (98.3 kg)    Fetal Status:  Fetal Heart Rate (bpm): 150        General: Alert, oriented and cooperative. Patient is in no acute distress.  Skin: Skin is warm and dry. No rash noted.   Cardiovascular: Normal heart rate noted  Respiratory: Normal respiratory effort, no problems with respiration noted  Abdomen: Soft, gravid, appropriate for gestational age.  Pain/Pressure: Absent     Pelvic: Cervical exam deferred        Extremities: Normal range of motion.  Edema: None  Mental Status: Normal mood and affect. Normal behavior. Normal judgment and thought content.   Assessment and Plan:  Pregnancy: G4P1021 at [redacted]w[redacted]d  1. Supervision of other normal pregnancy, antepartum (Primary) - Pt desires screening for Trisomy 13, 18, 21 - PANORAMA PRENATAL TEST - Discussed availability AFP at next RPV in 4 weeks to screen for Spina Bifida   Preterm labor symptoms and general obstetric precautions including but not limited to vaginal bleeding, contractions, leaking of fluid and fetal movement were reviewed in detail with the patient. Please refer to  After Visit Summary for other counseling recommendations.   No follow-ups on file.  Future Appointments  Date Time Provider Department Center  10/08/2023  3:35 PM Zelma Hidden, FNP [facility]  11/07/2023  2:00 PM [facility] PROVIDER 1 [facility]  11/07/2023  2:30 PM [facility] US2 [facility]  11/12/2023  3:55 PM Lillian Rein, MD [facility]  12/03/2023  3:35 PM Debbie Bellucci, Juvenal Opoka, CNM [facility]  12/25/2023  8:15 AM Kseniya Grunden, Juvenal Opoka, CNM [facility]  01/14/2024  3:55 PM Lillian Rein, MD [facility]  01/28/2024  3:35 PM Sakara Lehtinen, Juvenal Opoka, CNM [facility]  02/11/2024  3:55 PM Terrill Wauters, Juvenal Opoka, CNM [facility]  02/25/2024  3:55 PM Lillian Rein, MD [facility]  03/03/2024  3:35 PM Horst Ostermiller, Juvenal Opoka, CNM [facility]  03/10/2024  3:35 PM Nihal Marzella, Juvenal Opoka, CNM [facility]  03/17/2024  3:35 PM Tanvir Hipple, Juvenal Opoka, CNM [facility]  03/24/2024  3:35 PM Xia Stohr, Juvenal Opoka, CNM [facility]    Yolanda Hence, CNM

## 2023-09-17 LAB — PANORAMA PRENATAL TEST FULL PANEL:PANORAMA TEST PLUS 5 ADDITIONAL MICRODELETIONS: FETAL FRACTION: 5.9

## 2023-09-19 ENCOUNTER — Ambulatory Visit (HOSPITAL_BASED_OUTPATIENT_CLINIC_OR_DEPARTMENT_OTHER): Payer: Self-pay | Admitting: Certified Nurse Midwife

## 2023-09-24 ENCOUNTER — Ambulatory Visit (HOSPITAL_BASED_OUTPATIENT_CLINIC_OR_DEPARTMENT_OTHER): Payer: Self-pay | Admitting: Obstetrics & Gynecology

## 2023-09-24 DIAGNOSIS — O2342 Unspecified infection of urinary tract in pregnancy, second trimester: Secondary | ICD-10-CM

## 2023-09-24 DIAGNOSIS — O2340 Unspecified infection of urinary tract in pregnancy, unspecified trimester: Secondary | ICD-10-CM | POA: Insufficient documentation

## 2023-09-25 MED ORDER — NITROFURANTOIN MONOHYD MACRO 100 MG PO CAPS: 100.0000 mg | ORAL_CAPSULE | Freq: Two times a day (BID) | ORAL | 0 refills | Status: DC

## 2023-10-08 ENCOUNTER — Ambulatory Visit (HOSPITAL_BASED_OUTPATIENT_CLINIC_OR_DEPARTMENT_OTHER): Admitting: Obstetrics and Gynecology

## 2023-10-08 VITALS — BP 112/80 | HR 102 | Wt 222.0 lb

## 2023-10-08 DIAGNOSIS — Z3A16 16 weeks gestation of pregnancy: Secondary | ICD-10-CM

## 2023-10-08 DIAGNOSIS — Z3480 Encounter for supervision of other normal pregnancy, unspecified trimester: Secondary | ICD-10-CM

## 2023-10-08 DIAGNOSIS — R8271 Bacteriuria: Secondary | ICD-10-CM | POA: Diagnosis not present

## 2023-10-08 DIAGNOSIS — O99892 Other specified diseases and conditions complicating childbirth: Secondary | ICD-10-CM | POA: Diagnosis not present

## 2023-10-08 DIAGNOSIS — O99891 Other specified diseases and conditions complicating pregnancy: Secondary | ICD-10-CM

## 2023-10-08 NOTE — Patient Instructions (Signed)

## 2023-10-08 NOTE — Progress Notes (Signed)
   PRENATAL VISIT NOTE  Subjective:  Laurie Newman is a 33 y.o. [phone] at [redacted]w[redacted]d being seen today for ongoing prenatal care.  She is currently monitored for the following issues for this low-risk pregnancy and has Supervision of other normal pregnancy, antepartum; BMI 38.0-38.9,adult; and UTI (urinary tract infection) during pregnancy on their problem list.  Patient reports no complaints.  Contractions: Not present. Vag. Bleeding: None.  Movement: Present. Denies leaking of fluid.   The following portions of the patient's history were reviewed and updated as appropriate: allergies, current medications, past family history, past medical history, past social history, past surgical history and problem list.   Objective:    Vitals:   10/08/23 1546  BP: 112/80  Pulse: (!) 102  Weight: 222 lb (100.7 kg)    Fetal Status:  Fetal Heart Rate (bpm): 151   Movement: Present    General: Alert, oriented and cooperative. Patient is in no acute distress.  Skin: Skin is warm and dry. No rash noted.   Cardiovascular: Normal heart rate noted  Respiratory: Normal respiratory effort, no problems with respiration noted  Abdomen: Soft, gravid, appropriate for gestational age.  Pain/Pressure: Absent     Pelvic: Cervical exam deferred        Extremities: Normal range of motion.  Edema: None  Mental Status: Normal mood and affect. Normal behavior. Normal judgment and thought content.   Assessment and Plan:  Pregnancy: G4P1021 at [redacted]w[redacted]d 1. Supervision of other normal pregnancy, antepartum (Primary) BP and FHR normal Doing well overall  2. [redacted] weeks gestation of pregnancy Anatomy scan 7/16 Repeat urine culture today  Peds list given Planning on circ  3. Asymptomatic bacteriuria during pregnancy  - Urine Culture   Preterm labor symptoms and general obstetric precautions including but not limited to vaginal bleeding, contractions, leaking of fluid and fetal movement were reviewed in detail with the  patient. Please refer to After Visit Summary for other counseling recommendations.   Return in 4 weeks for routine prenatal   Future Appointments  Date Time Provider Department Center  11/07/2023  2:00 PM [facility] PROVIDER 1 [facility]  11/07/2023  2:30 PM [facility] US2 [facility]  12/03/2023  3:35 PM Lo, Juvenal Opoka, CNM [facility]  12/25/2023  8:15 AM Lo, Juvenal Opoka, CNM [facility]  01/14/2024  3:55 PM Lillian Rein, MD [facility]  01/28/2024  3:35 PM Lo, Juvenal Opoka, CNM [facility]  02/11/2024  3:55 PM Lo, Juvenal Opoka, CNM [facility]  02/25/2024  3:55 PM Lillian Rein, MD [facility]  03/03/2024  3:35 PM Lo, Juvenal Opoka, CNM [facility]  03/10/2024  3:35 PM Lo, Juvenal Opoka, CNM [facility]  03/17/2024  3:35 PM Lo, Juvenal Opoka, CNM [facility]  03/24/2024  3:35 PM Lo, Juvenal Opoka, CNM [facility]    Susi Eric, FNP

## 2023-10-10 ENCOUNTER — Ambulatory Visit: Payer: Self-pay | Admitting: Obstetrics and Gynecology

## 2023-10-10 LAB — URINE CULTURE

## 2023-10-25 DIAGNOSIS — O99210 Obesity complicating pregnancy, unspecified trimester: Secondary | ICD-10-CM | POA: Insufficient documentation

## 2023-11-07 ENCOUNTER — Ambulatory Visit: Attending: Obstetrics & Gynecology

## 2023-11-07 ENCOUNTER — Other Ambulatory Visit: Payer: Self-pay | Admitting: *Deleted

## 2023-11-07 ENCOUNTER — Ambulatory Visit (HOSPITAL_BASED_OUTPATIENT_CLINIC_OR_DEPARTMENT_OTHER): Admitting: Maternal & Fetal Medicine

## 2023-11-07 VITALS — BP 128/69 | HR 117

## 2023-11-07 DIAGNOSIS — Z3A20 20 weeks gestation of pregnancy: Secondary | ICD-10-CM

## 2023-11-07 DIAGNOSIS — O99210 Obesity complicating pregnancy, unspecified trimester: Secondary | ICD-10-CM

## 2023-11-07 DIAGNOSIS — Z363 Encounter for antenatal screening for malformations: Secondary | ICD-10-CM | POA: Insufficient documentation

## 2023-11-07 DIAGNOSIS — O99212 Obesity complicating pregnancy, second trimester: Secondary | ICD-10-CM | POA: Diagnosis not present

## 2023-11-07 DIAGNOSIS — Z3480 Encounter for supervision of other normal pregnancy, unspecified trimester: Secondary | ICD-10-CM

## 2023-11-07 DIAGNOSIS — Z3481 Encounter for supervision of other normal pregnancy, first trimester: Secondary | ICD-10-CM | POA: Diagnosis not present

## 2023-11-07 DIAGNOSIS — Z6838 Body mass index (BMI) 38.0-38.9, adult: Secondary | ICD-10-CM | POA: Insufficient documentation

## 2023-11-12 ENCOUNTER — Encounter (HOSPITAL_BASED_OUTPATIENT_CLINIC_OR_DEPARTMENT_OTHER): Admitting: Obstetrics & Gynecology

## 2023-11-25 NOTE — Progress Notes (Signed)
  After review, MFM consult with provider is not indicated for today  Laurie Nathanel Pipe, MD 11/25/2023 2:10 PM  [facility]

## 2023-12-03 ENCOUNTER — Ambulatory Visit (HOSPITAL_BASED_OUTPATIENT_CLINIC_OR_DEPARTMENT_OTHER): Admitting: Certified Nurse Midwife

## 2023-12-03 VITALS — BP 147/72 | HR 109 | Wt 223.2 lb

## 2023-12-03 DIAGNOSIS — Z3A24 24 weeks gestation of pregnancy: Secondary | ICD-10-CM

## 2023-12-03 DIAGNOSIS — O99210 Obesity complicating pregnancy, unspecified trimester: Secondary | ICD-10-CM

## 2023-12-03 DIAGNOSIS — Z3480 Encounter for supervision of other normal pregnancy, unspecified trimester: Secondary | ICD-10-CM

## 2023-12-03 DIAGNOSIS — O2342 Unspecified infection of urinary tract in pregnancy, second trimester: Secondary | ICD-10-CM | POA: Diagnosis not present

## 2023-12-03 DIAGNOSIS — E669 Obesity, unspecified: Secondary | ICD-10-CM

## 2023-12-03 DIAGNOSIS — O99212 Obesity complicating pregnancy, second trimester: Secondary | ICD-10-CM | POA: Diagnosis not present

## 2023-12-07 NOTE — Progress Notes (Signed)
   PRENATAL VISIT NOTE  Subjective:  Laurie Newman is a 33 y.o. [phone] at [redacted]w[redacted]d being seen today for ongoing prenatal care.  She is currently monitored for the following issues for this  pregnancy and has Supervision of other normal pregnancy, antepartum; BMI 38.0-38.9,adult; UTI (urinary tract infection) during pregnancy; and Obesity affecting pregnancy, antepartum on their problem list.  Patient reports no complaints.  Contractions: Not present. Vag. Bleeding: None.  Movement: Present. Denies leaking of fluid.   The following portions of the patient's history were reviewed and updated as appropriate: allergies, current medications, past family history, past medical history, past social history, past surgical history and problem list.   Objective:    Vitals:   12/03/23 1544  BP: (!) 147/72  Pulse: (!) 109  Weight: 223 lb 3.2 oz (101.2 kg)    Fetal Status:  Fetal Heart Rate (bpm): 140   Movement: Present    General: Alert, oriented and cooperative. Patient is in no acute distress.  Skin: Skin is warm and dry. No rash noted.   Cardiovascular: Normal heart rate noted  Respiratory: Normal respiratory effort, no problems with respiration noted  Abdomen: Soft, gravid, appropriate for gestational age.  Pain/Pressure: Absent     Pelvic: Cervical exam deferred        Extremities: Normal range of motion.  Edema: None  Mental Status: Normal mood and affect. Normal behavior. Normal judgment and thought content.   Assessment and Plan:  Pregnancy: G4P1021 at [redacted]w[redacted]d  1. Urinary tract infection in mother during second trimester of pregnancy (Primary) - TOC Negative  2. Supervision of other normal pregnancy, antepartum   3. Obesity affecting pregnancy, antepartum, unspecified obesity type   4. [redacted] weeks gestation of pregnancy     Preterm labor symptoms and general obstetric precautions including but not limited to vaginal bleeding, contractions, leaking of fluid and fetal movement  were reviewed in detail with the patient. Please refer to After Visit Summary for other counseling recommendations.   No follow-ups on file.  Future Appointments  Date Time Provider Department Center  12/25/2023  8:15 AM Honi Name, Arland MARLA, CNM [facility]  01/14/2024  3:55 PM Cleotilde Ronal RAMAN, MD [facility]  01/28/2024  3:35 PM Tad, Arland MARLA, CNM [facility]  01/30/2024  2:00 PM [facility] PROVIDER 1 [facility]  01/30/2024  2:30 PM [facility] US1 [facility]  02/11/2024  3:55 PM Jerica Creegan, Arland MARLA, CNM [facility]  02/25/2024  3:55 PM Cleotilde Ronal RAMAN, MD [facility]  03/03/2024  3:35 PM Oza Oberle, Arland MARLA, CNM [facility]  03/10/2024  3:35 PM Krishika Bugge, Arland MARLA, CNM [facility]  03/17/2024  3:35 PM Kardell Virgil, Arland MARLA, CNM [facility]  03/24/2024  3:35 PM Vonne Mcdanel, Arland MARLA, CNM [facility]    Arland MARLA Tad, CNM

## 2023-12-24 ENCOUNTER — Encounter (HOSPITAL_BASED_OUTPATIENT_CLINIC_OR_DEPARTMENT_OTHER): Payer: Self-pay | Admitting: *Deleted

## 2023-12-25 ENCOUNTER — Encounter (HOSPITAL_BASED_OUTPATIENT_CLINIC_OR_DEPARTMENT_OTHER): Admitting: Certified Nurse Midwife

## 2023-12-25 ENCOUNTER — Other Ambulatory Visit (HOSPITAL_BASED_OUTPATIENT_CLINIC_OR_DEPARTMENT_OTHER)

## 2023-12-25 DIAGNOSIS — Z3480 Encounter for supervision of other normal pregnancy, unspecified trimester: Secondary | ICD-10-CM

## 2023-12-25 DIAGNOSIS — Z3A27 27 weeks gestation of pregnancy: Secondary | ICD-10-CM

## 2023-12-25 NOTE — Progress Notes (Signed)
 Patient came in for GTT testing. tbw

## 2023-12-26 ENCOUNTER — Ambulatory Visit (HOSPITAL_BASED_OUTPATIENT_CLINIC_OR_DEPARTMENT_OTHER): Payer: Self-pay | Admitting: Certified Nurse Midwife

## 2023-12-26 LAB — CBC
Hematocrit: 40.4 % (ref 34.0–46.6)
Hemoglobin: 13.1 g/dL (ref 11.1–15.9)
MCH: 29.4 pg (ref 26.6–33.0)
MCHC: 32.4 g/dL (ref 31.5–35.7)
MCV: 91 fL (ref 79–97)
Platelets: 312 x10E3/uL (ref 150–450)
RBC: 4.45 x10E6/uL (ref 3.77–5.28)
RDW: 12.4 % (ref 11.7–15.4)
WBC: 9.3 x10E3/uL (ref 3.4–10.8)

## 2023-12-26 LAB — GLUCOSE TOLERANCE, 2 HOURS W/ 1HR
Glucose, 1 hour: 143 mg/dL (ref 70–179)
Glucose, 2 hour: 118 mg/dL (ref 70–152)
Glucose, Fasting: 81 mg/dL (ref 70–91)

## 2023-12-26 LAB — HIV ANTIBODY (ROUTINE TESTING W REFLEX): HIV Screen 4th Generation wRfx: NONREACTIVE

## 2023-12-26 LAB — RPR: RPR Ser Ql: NONREACTIVE

## 2023-12-31 ENCOUNTER — Encounter (HOSPITAL_BASED_OUTPATIENT_CLINIC_OR_DEPARTMENT_OTHER): Payer: Self-pay

## 2023-12-31 ENCOUNTER — Encounter (HOSPITAL_BASED_OUTPATIENT_CLINIC_OR_DEPARTMENT_OTHER): Admitting: Certified Nurse Midwife

## 2024-01-14 ENCOUNTER — Ambulatory Visit (INDEPENDENT_AMBULATORY_CARE_PROVIDER_SITE_OTHER): Admitting: Obstetrics & Gynecology

## 2024-01-14 ENCOUNTER — Encounter (HOSPITAL_BASED_OUTPATIENT_CLINIC_OR_DEPARTMENT_OTHER): Payer: Self-pay | Admitting: Obstetrics & Gynecology

## 2024-01-14 VITALS — BP 117/81 | HR 116 | Wt 225.0 lb

## 2024-01-14 DIAGNOSIS — E669 Obesity, unspecified: Secondary | ICD-10-CM

## 2024-01-14 DIAGNOSIS — Z3A32 32 weeks gestation of pregnancy: Secondary | ICD-10-CM | POA: Diagnosis not present

## 2024-01-14 DIAGNOSIS — O99212 Obesity complicating pregnancy, second trimester: Secondary | ICD-10-CM | POA: Diagnosis not present

## 2024-01-14 DIAGNOSIS — Z6838 Body mass index (BMI) 38.0-38.9, adult: Secondary | ICD-10-CM

## 2024-01-14 DIAGNOSIS — R21 Rash and other nonspecific skin eruption: Secondary | ICD-10-CM

## 2024-01-14 DIAGNOSIS — Z23 Encounter for immunization: Secondary | ICD-10-CM | POA: Diagnosis not present

## 2024-01-14 DIAGNOSIS — Z3A30 30 weeks gestation of pregnancy: Secondary | ICD-10-CM

## 2024-01-14 DIAGNOSIS — Z3480 Encounter for supervision of other normal pregnancy, unspecified trimester: Secondary | ICD-10-CM

## 2024-01-14 MED ORDER — TRIAMCINOLONE ACETONIDE 0.5 % EX CREA: 1.0000 | TOPICAL_CREAM | Freq: Three times a day (TID) | CUTANEOUS | 2 refills | Status: AC

## 2024-01-14 MED ORDER — PRENATAL 27-0.8 MG PO TABS: ORAL_TABLET | ORAL | 12 refills | Status: AC

## 2024-01-14 NOTE — Progress Notes (Signed)
   PRENATAL VISIT NOTE  Subjective:  Laurie Newman is a 33 y.o. [phone] at [redacted]w[redacted]d being seen today for ongoing prenatal care.  She is currently monitored for the following issues for this low-risk pregnancy and has Supervision of other normal pregnancy, antepartum; BMI 38.0-38.9,adult; and Obesity affecting pregnancy, antepartum on their problem list.  Patient reports no complaints.  Contractions: Not present. Vag. Bleeding: None.  Movement: Present. Denies leaking of fluid.   Questions about water birth discussed.  She is going to look into the class.  The following portions of the patient's history were reviewed and updated as appropriate: allergies, current medications, past family history, past medical history, past social history, past surgical history and problem list.   Objective:    Vitals:   01/14/24 1559  BP: 117/81  Pulse: (!) 116  Weight: 225 lb (102.1 kg)    Fetal Status:  Fetal Heart Rate (bpm): 145 Fundal Height: 32 cm Movement: Present    General: Alert, oriented and cooperative. Patient is in no acute distress.  Skin: Skin is warm and dry. No rash noted.   Cardiovascular: Normal heart rate noted  Respiratory: Normal respiratory effort, no problems with respiration noted  Abdomen: Soft, gravid, appropriate for gestational age.  Pain/Pressure: Absent     Pelvic: Cervical exam deferred        Extremities: Normal range of motion.  Edema: Trace  Mental Status: Normal mood and affect. Normal behavior. Normal judgment and thought content.   Assessment and Plan:  Pregnancy: G4P1021 at 110w1d 1. Supervision of other normal pregnancy, antepartum (Primary) - on PNV and baby ASA - recheck 2 weeks - has growth scan with MFM scheduled due to hx of IUGR with prior pregnancy  2. BMI 38.0-38.9,adult  3. [redacted] weeks gestation of pregnancy - tdap given today  4. Rash - Bile acids, total - triamcinolone  cream (KENALOG ) 0.5 %; Apply 1 Application topically 3 (three) times  daily.  Dispense: 30 g; Refill: 2 - may need to have pt see dermatology but will await results  Preterm labor symptoms and general obstetric precautions including but not limited to vaginal bleeding, contractions, leaking of fluid and fetal movement were reviewed in detail with the patient. Please refer to After Visit Summary for other counseling recommendations.   Return in about 2 weeks (around 01/28/2024).  Future Appointments  Date Time Provider Department Center  01/28/2024  3:35 PM Tad Arland MARLA, CNM [facility]  01/30/2024  2:00 PM [facility] PROVIDER 1 [facility]  01/30/2024  2:30 PM [facility]1 [facility]  02/11/2024  3:55 PM Lo, Arland MARLA, CNM [facility]  02/26/2024  3:55 PM Cleotilde Ronal RAMAN, MD [facility]  03/04/2024  2:35 PM Cleotilde Ronal RAMAN, MD [facility]  03/10/2024  3:35 PM Lo, Arland MARLA, CNM [facility]  03/17/2024  3:35 PM Lo, Arland MARLA, CNM [facility]  03/24/2024  3:35 PM Lo, Arland MARLA, CNM [facility] [phone] [facility]    Ronal RAMAN Cleotilde, MD

## 2024-01-15 LAB — BILE ACIDS, TOTAL: Bile Acids Total: 3.9 umol/L (ref 0.0–10.0)

## 2024-01-16 ENCOUNTER — Ambulatory Visit (HOSPITAL_BASED_OUTPATIENT_CLINIC_OR_DEPARTMENT_OTHER): Payer: Self-pay | Admitting: Obstetrics & Gynecology

## 2024-01-16 ENCOUNTER — Encounter (HOSPITAL_BASED_OUTPATIENT_CLINIC_OR_DEPARTMENT_OTHER): Payer: Self-pay | Admitting: Obstetrics & Gynecology

## 2024-01-28 ENCOUNTER — Ambulatory Visit (HOSPITAL_BASED_OUTPATIENT_CLINIC_OR_DEPARTMENT_OTHER): Admitting: Certified Nurse Midwife

## 2024-01-28 ENCOUNTER — Other Ambulatory Visit (HOSPITAL_BASED_OUTPATIENT_CLINIC_OR_DEPARTMENT_OTHER): Payer: Self-pay

## 2024-01-28 VITALS — BP 122/80 | HR 104 | Wt 229.6 lb

## 2024-01-28 DIAGNOSIS — R21 Rash and other nonspecific skin eruption: Secondary | ICD-10-CM | POA: Diagnosis not present

## 2024-01-28 DIAGNOSIS — Z3A32 32 weeks gestation of pregnancy: Secondary | ICD-10-CM | POA: Diagnosis not present

## 2024-01-28 DIAGNOSIS — O26893 Other specified pregnancy related conditions, third trimester: Secondary | ICD-10-CM

## 2024-01-28 DIAGNOSIS — Z3480 Encounter for supervision of other normal pregnancy, unspecified trimester: Secondary | ICD-10-CM

## 2024-01-28 MED ORDER — ABRYSVO 120 MCG/0.5ML IM SOLR
0.5000 mL | Freq: Once | INTRAMUSCULAR | 0 refills | Status: AC
  Filled 2024-01-28: qty 0.5, 1d supply, fill #0

## 2024-01-28 NOTE — Progress Notes (Signed)
   PRENATAL VISIT NOTE  Subjective:  Laurie Newman is a 33 y.o. [phone] at [redacted]w[redacted]d being seen today for ongoing prenatal care.  She is currently monitored for the following issues for this low-risk pregnancy and has Supervision of other normal pregnancy, antepartum; BMI 38.0-38.9,adult; and Obesity affecting pregnancy, antepartum on their problem list.  Patient reports no bleeding, no contractions, no cramping, and no leaking.  Contractions: Not present. Vag. Bleeding: None.  Movement: Present. Denies leaking of fluid.   The following portions of the patient's history were reviewed and updated as appropriate: allergies, current medications, past family history, past medical history, past social history, past surgical history and problem list.   Objective:    Vitals:   01/28/24 1546  BP: 122/80  Pulse: (!) 104  Weight: 229 lb 9.6 oz (104.1 kg)    Fetal Status:      Movement: Present    General: Alert, oriented and cooperative. Patient is in no acute distress.  Skin: Skin is warm and dry. No rash noted.   Cardiovascular: Normal heart rate noted  Respiratory: Normal respiratory effort, no problems with respiration noted  Abdomen: Soft, gravid, appropriate for gestational age.  Pain/Pressure: Absent     Pelvic: Cervical exam deferred        Extremities: Normal range of motion.  Edema: None  Mental Status: Normal mood and affect. Normal behavior. Normal judgment and thought content.   Assessment and Plan:  Pregnancy: G4P1021 at [redacted]w[redacted]d  1. Supervision of other normal pregnancy, antepartum (Primary) - on PNV and baby ASA - recheck 2 weeks - has growth scan with MFM scheduled due to hx of IUGR with prior pregnancy   2. BMI 38.0-38.9,adult   3. [redacted] weeks gestation of pregnancy - Pt plans RSV Vaccine today (01/28/24)   4. Rash - Bile acids, total normal - Pt reports rash is resolving after applying Kenalog  ointment.   Preterm labor symptoms and general obstetric precautions  including but not limited to vaginal bleeding, contractions, leaking of fluid and fetal movement were reviewed in detail with the patient. Please refer to After Visit Summary for other counseling recommendations.   No follow-ups on file.  Future Appointments  Date Time Provider Department Center  01/30/2024  2:00 PM [facility] PROVIDER 1 [facility]  01/30/2024  2:30 PM [facility] US1 [facility]  02/11/2024  3:55 PM Galilea Quito, Arland MARLA, CNM [facility]  02/26/2024  3:55 PM Cleotilde Ronal RAMAN, MD [facility]  03/04/2024  2:35 PM Cleotilde Ronal RAMAN, MD [facility]  03/10/2024  3:35 PM Dellas Guard, Arland MARLA, CNM [facility]  03/17/2024  3:35 PM Soloman Mckeithan, Arland MARLA, CNM [facility]  03/24/2024  3:35 PM Gwenlyn Hottinger, Arland MARLA, CNM [facility] [phone] [facility]    Arland MARLA Roller, CNM

## 2024-01-30 ENCOUNTER — Ambulatory Visit: Attending: Obstetrics and Gynecology | Admitting: Maternal & Fetal Medicine

## 2024-01-30 ENCOUNTER — Other Ambulatory Visit (HOSPITAL_BASED_OUTPATIENT_CLINIC_OR_DEPARTMENT_OTHER): Payer: Self-pay

## 2024-01-30 ENCOUNTER — Ambulatory Visit

## 2024-01-30 VITALS — BP 131/70 | HR 105

## 2024-01-30 DIAGNOSIS — O99213 Obesity complicating pregnancy, third trimester: Secondary | ICD-10-CM

## 2024-01-30 DIAGNOSIS — O0993 Supervision of high risk pregnancy, unspecified, third trimester: Secondary | ICD-10-CM | POA: Diagnosis not present

## 2024-01-30 DIAGNOSIS — O99210 Obesity complicating pregnancy, unspecified trimester: Secondary | ICD-10-CM

## 2024-01-30 DIAGNOSIS — Z3A32 32 weeks gestation of pregnancy: Secondary | ICD-10-CM

## 2024-01-30 DIAGNOSIS — Z6838 Body mass index (BMI) 38.0-38.9, adult: Secondary | ICD-10-CM

## 2024-01-30 DIAGNOSIS — O99212 Obesity complicating pregnancy, second trimester: Secondary | ICD-10-CM

## 2024-01-30 DIAGNOSIS — Z3480 Encounter for supervision of other normal pregnancy, unspecified trimester: Secondary | ICD-10-CM

## 2024-01-30 DIAGNOSIS — E669 Obesity, unspecified: Secondary | ICD-10-CM

## 2024-01-30 DIAGNOSIS — Z3689 Encounter for other specified antenatal screening: Secondary | ICD-10-CM | POA: Insufficient documentation

## 2024-01-30 NOTE — Progress Notes (Addendum)
   Patient information  Patient Name: Laurie [facility] [place]  Patient MRN:   [id number]  Referring practice: MFM Referring Provider: [facility]  Problem List   Patient Active Problem List   Diagnosis Date Noted   Obesity affecting pregnancy, antepartum 10/25/2023   BMI 38.0-38.9,adult 09/05/2023   Supervision of other normal pregnancy, antepartum 09/04/2023   Maternal Fetal medicine Consult  Laurie Newman is a 33 y.o. [id number] at [redacted]w[redacted]d here for ultrasound and consultation. Laurie Newman is doing well today with no acute concerns. Today we focused on the following:   The patient is here for a follow-up growth ultrasound due to elevated BMI.  The growth is normal today and there are no other indications for future ultrasounds.  I discussed that if her blood pressure were to increase or if she has a fundal height discrepancy would be happy to see her back.  She reports good fetal movement and will continue to attend her prenatal visits.  The patient had time to ask questions that were answered to her satisfaction.  She verbalized understanding and agrees to proceed with the plan below.  Sonographic findings Single intrauterine pregnancy at 32w 3d.  Fetal cardiac activity:  Observed and appears normal. Presentation: Cephalic. Interval fetal anatomy appears normal. Fetal biometry shows the estimated fetal weight at the 54 percentile. Amniotic fluid volume: Within normal limits. MVP: 3.8 cm. Placenta: Posterior.  There are limitations of prenatal ultrasound such as the inability to detect certain abnormalities due to poor visualization. Various factors such as fetal position, gestational age and maternal body habitus may increase the difficulty in visualizing the fetal anatomy.    Recommendations -No further ultrasounds are recommended at this time based on the current indications. If future indications arise (e.g. size/date discrepancy on fundal height, gestational diabetes or  hypertension) and an ultrasound is to be desired at our MFM office, please send a referral.   Review of Systems: A review of systems was performed and was negative except per HPI   Vitals and Physical Exam    01/30/2024    2:21 PM 01/28/2024    3:46 PM 01/14/2024    3:59 PM  Vitals with BMI  Weight  229 lbs 10 oz 225 lbs  Systolic 131 122 882  Diastolic 70 80 81  Pulse 105 104 116    Sitting comfortably on the sonogram table Nonlabored breathing Normal rate and rhythm Abdomen is nontender  Past pregnancies OB History  Gravida Para Term Preterm AB Living  4 1 1  0 2 1  SAB IAB Ectopic Multiple Live Births  2 0 0 0 1    # Outcome Date GA Lbr Len/2nd Weight Sex Type Anes PTL Lv  4 Current           3 SAB 2022          2 Term 01/30/20 [redacted]w[redacted]d  6 lb 5 oz (2.863 kg) M Vag-Spont EPI N LIV  1 SAB 2020             I spent 10 minutes reviewing the patients chart, including labs and images as well as counseling the patient about her medical conditions. Greater than 50% of the time was spent in direct face-to-face patient counseling.  Laurie Newman  MFM, [facility]   01/30/2024  3:38 PM

## 2024-02-11 ENCOUNTER — Ambulatory Visit (HOSPITAL_BASED_OUTPATIENT_CLINIC_OR_DEPARTMENT_OTHER): Admitting: Certified Nurse Midwife

## 2024-02-11 VITALS — BP 115/76 | HR 103 | Wt 226.2 lb

## 2024-02-11 DIAGNOSIS — Z3483 Encounter for supervision of other normal pregnancy, third trimester: Secondary | ICD-10-CM | POA: Diagnosis not present

## 2024-02-11 DIAGNOSIS — O0993 Supervision of high risk pregnancy, unspecified, third trimester: Secondary | ICD-10-CM

## 2024-02-11 DIAGNOSIS — Z3A34 34 weeks gestation of pregnancy: Secondary | ICD-10-CM | POA: Diagnosis not present

## 2024-02-11 NOTE — Progress Notes (Signed)
   PRENATAL VISIT NOTE  Subjective:  Laurie Newman is a 33 y.o. [phone] at [redacted]w[redacted]d being seen today for ongoing prenatal care.  She is currently monitored for the following issues for this low-risk pregnancy and has Supervision of high risk pregnancy, antepartum, third trimester; BMI 38.0-38.9,adult; and Obesity affecting pregnancy, antepartum on their problem list.  Patient reports no complaints.  Contractions: Not present. Vag. Bleeding: None.  Movement: Present. Denies leaking of fluid.   The following portions of the patient's history were reviewed and updated as appropriate: allergies, current medications, past family history, past medical history, past social history, past surgical history and problem list.   Objective:    Vitals:   02/11/24 1610  BP: 115/76  Pulse: (!) 103  Weight: 226 lb 3.2 oz (102.6 kg)    Fetal Status:  Fetal Heart Rate (bpm): 150 Fundal Height: 34 cm Movement: Present Presentation: Vertex  General: Alert, oriented and cooperative. Patient is in no acute distress.  Skin: Skin is warm and dry. No rash noted.   Cardiovascular: Normal heart rate noted  Respiratory: Normal respiratory effort, no problems with respiration noted  Abdomen: Soft, gravid, appropriate for gestational age.  Pain/Pressure: Absent     Pelvic: Cervical exam deferred        Extremities: Normal range of motion.  Edema: None  Mental Status: Normal mood and affect. Normal behavior. Normal judgment and thought content.   Assessment and Plan:  Pregnancy: G4P1021 at [redacted]w[redacted]d  1. Supervision of other normal pregnancy, antepartum (Primary) - on PNV and baby ASA - RSV and Tdap Vaccines UTD - Declines Flu Vaccine but may possibly desire it at 36 week visit - Discussed GBS/GC/CT and pap smear at next prenatal visit in 2 weeks - recheck 2 weeks   2. BMI 38.0-38.9,adult- - 10lb total weight gain in pregnancy   3. [redacted] weeks gestation of pregnancy   Preterm labor symptoms and general  obstetric precautions including but not limited to vaginal bleeding, contractions, leaking of fluid and fetal movement were reviewed in detail with the patient. Please refer to After Visit Summary for other counseling recommendations.   No follow-ups on file.  Future Appointments  Date Time Provider Department Center  02/26/2024  3:55 PM Cleotilde Ronal RAMAN, MD [facility]  03/04/2024  2:35 PM Cleotilde Ronal RAMAN, MD [facility] [phone] [facility]  03/10/2024  3:35 PM Tad, Arland MARLA, CNM [facility] [phone] [facility]  03/17/2024  3:35 PM Kimberl Vig, Arland MARLA, CNM [facility]  03/24/2024  3:35 PM Wisdom Seybold, Arland MARLA, CNM [facility] [phone] [facility]    Arland MARLA Tad, CNM

## 2024-02-22 ENCOUNTER — Other Ambulatory Visit: Payer: Self-pay

## 2024-02-22 MED ORDER — FLUZONE 0.5 ML IM SUSY
0.5000 mL | PREFILLED_SYRINGE | Freq: Once | INTRAMUSCULAR | 0 refills | Status: AC
  Filled 2024-02-22: qty 0.5, 1d supply, fill #0

## 2024-02-25 ENCOUNTER — Encounter (HOSPITAL_BASED_OUTPATIENT_CLINIC_OR_DEPARTMENT_OTHER): Admitting: Obstetrics & Gynecology

## 2024-02-26 ENCOUNTER — Ambulatory Visit (INDEPENDENT_AMBULATORY_CARE_PROVIDER_SITE_OTHER): Admitting: Obstetrics & Gynecology

## 2024-02-26 ENCOUNTER — Other Ambulatory Visit (HOSPITAL_COMMUNITY)
Admission: RE | Admit: 2024-02-26 | Discharge: 2024-02-26 | Disposition: A | Discharge status: Home / Self Care | Source: Ambulatory Visit | Attending: Obstetrics & Gynecology | Admitting: Obstetrics & Gynecology

## 2024-02-26 VITALS — BP 124/81 | HR 105 | Wt 228.0 lb

## 2024-02-26 DIAGNOSIS — O99213 Obesity complicating pregnancy, third trimester: Secondary | ICD-10-CM

## 2024-02-26 DIAGNOSIS — O0993 Supervision of high risk pregnancy, unspecified, third trimester: Secondary | ICD-10-CM | POA: Diagnosis present

## 2024-02-26 DIAGNOSIS — Z6841 Body Mass Index (BMI) 40.0 and over, adult: Secondary | ICD-10-CM

## 2024-02-26 DIAGNOSIS — Z3A36 36 weeks gestation of pregnancy: Secondary | ICD-10-CM | POA: Insufficient documentation

## 2024-02-26 DIAGNOSIS — Z1332 Encounter for screening for maternal depression: Secondary | ICD-10-CM

## 2024-02-26 DIAGNOSIS — E669 Obesity, unspecified: Secondary | ICD-10-CM | POA: Diagnosis not present

## 2024-02-26 NOTE — Patient Instructions (Signed)

## 2024-02-26 NOTE — Progress Notes (Signed)
   PRENATAL VISIT NOTE  Subjective:  Laurie Newman is a 33 y.o. [phone] at [redacted]w[redacted]d being seen today for ongoing prenatal care.  She is currently monitored for the following issues for this low-risk pregnancy and has Supervision of high risk pregnancy, antepartum, third trimester; BMI 38.0-38.9,adult; and Obesity affecting pregnancy, antepartum on their problem list.  Patient reports no complaints.  Contractions: Irregular. Vag. Bleeding: None.  Movement: Present. Denies leaking of fluid.   The following portions of the patient's history were reviewed and updated as appropriate: allergies, current medications, past family history, past medical history, past social history, past surgical history and problem list.   Objective:   Vitals:   02/26/24 1608  BP: 124/81  Pulse: (!) 105  Weight: 228 lb (103.4 kg)    Fetal Status:  Fetal Heart Rate (bpm): 149 Fundal Height: 37 cm Movement: Present Presentation: Vertex  General: Alert, oriented and cooperative. Patient is in no acute distress.  Skin: Skin is warm and dry. No rash noted.   Cardiovascular: Normal heart rate noted  Respiratory: Normal respiratory effort, no problems with respiration noted  Abdomen: Soft, gravid, appropriate for gestational age.  Pain/Pressure: Present (lower pelvic pressure)     Pelvic: Cervical exam deferred        Extremities: Normal range of motion.  Edema: None  Mental Status: Normal mood and affect. Normal behavior. Normal judgment and thought content.      02/26/2024    4:47 PM 09/04/2023    4:10 PM  Depression screen PHQ 2/9  Decreased Interest 1 0  Down, Depressed, Hopeless 0 0  PHQ - 2 Score 1 0  Altered sleeping 3 0  Tired, decreased energy 3 1  Change in appetite 0 0  Feeling bad or failure about yourself  0 0  Trouble concentrating 0 0  Moving slowly or fidgety/restless 0 0  Suicidal thoughts 0 0  PHQ-9 Score 7 1        02/26/2024    4:59 PM 09/04/2023    4:11 PM  GAD 7 : Generalized  Anxiety Score  Nervous, Anxious, on Edge 1 0  Control/stop worrying 0 0  Worry too much - different things 0 0  Trouble relaxing 0 0  Restless 0 0  Easily annoyed or irritable 1 1  Afraid - awful might happen 0 0  Total GAD 7 Score 2 1    Assessment and Plan:  Pregnancy: [id number] at [redacted]w[redacted]d 1. Supervision of high risk pregnancy, antepartum, third trimester (Primary) - on PNV and baby ASA - Culture, beta strep (group b only) - Cervicovaginal ancillary only( [place]) - recheck 1 weeks  2. BMI 40.0-44.9, adult (HCC)  3. [redacted] weeks gestation of pregnancy - Culture, beta strep (group b only) - Cervicovaginal ancillary only( [place])  Preterm labor symptoms and general obstetric precautions including but not limited to vaginal bleeding, contractions, leaking of fluid and fetal movement were reviewed in detail with the patient. Please refer to After Visit Summary for other counseling recommendations.   Return in about 1 week (around 03/04/2024).  Future Appointments  Date Time Provider Department Center  03/04/2024  2:35 PM Cleotilde Ronal RAMAN, MD [facility]  03/10/2024  3:35 PM Tad, Arland MARLA, CNM [facility] [phone] [facility]  03/17/2024  3:35 PM Lo, Arland MARLA, CNM [facility]  03/24/2024  3:35 PM Lo, Arland MARLA, CNM [facility] [phone] [facility]    Ronal RAMAN Cleotilde, MD

## 2024-02-28 LAB — CERVICOVAGINAL ANCILLARY ONLY
Chlamydia: NEGATIVE
Comment: NEGATIVE
Comment: NORMAL
Neisseria Gonorrhea: NEGATIVE

## 2024-03-01 LAB — CULTURE, BETA STREP (GROUP B ONLY): Strep Gp B Culture: NEGATIVE

## 2024-03-02 ENCOUNTER — Ambulatory Visit (HOSPITAL_BASED_OUTPATIENT_CLINIC_OR_DEPARTMENT_OTHER): Payer: Self-pay | Admitting: Obstetrics & Gynecology

## 2024-03-02 DIAGNOSIS — O0993 Supervision of high risk pregnancy, unspecified, third trimester: Secondary | ICD-10-CM

## 2024-03-03 ENCOUNTER — Encounter (HOSPITAL_BASED_OUTPATIENT_CLINIC_OR_DEPARTMENT_OTHER): Admitting: Certified Nurse Midwife

## 2024-03-04 ENCOUNTER — Ambulatory Visit (HOSPITAL_BASED_OUTPATIENT_CLINIC_OR_DEPARTMENT_OTHER): Admitting: Obstetrics & Gynecology

## 2024-03-04 ENCOUNTER — Encounter (HOSPITAL_BASED_OUTPATIENT_CLINIC_OR_DEPARTMENT_OTHER): Payer: Self-pay | Admitting: Obstetrics & Gynecology

## 2024-03-04 VITALS — BP 121/86 | HR 110 | Wt 231.8 lb

## 2024-03-04 DIAGNOSIS — Z3A37 37 weeks gestation of pregnancy: Secondary | ICD-10-CM

## 2024-03-04 DIAGNOSIS — O99210 Obesity complicating pregnancy, unspecified trimester: Secondary | ICD-10-CM

## 2024-03-04 DIAGNOSIS — O99213 Obesity complicating pregnancy, third trimester: Secondary | ICD-10-CM | POA: Diagnosis not present

## 2024-03-04 DIAGNOSIS — E669 Obesity, unspecified: Secondary | ICD-10-CM

## 2024-03-04 DIAGNOSIS — O0993 Supervision of high risk pregnancy, unspecified, third trimester: Secondary | ICD-10-CM

## 2024-03-04 NOTE — Progress Notes (Signed)
   PRENATAL VISIT NOTE  Subjective:  Laurie Newman is a 33 y.o. [phone] at [redacted]w[redacted]d being seen today for ongoing prenatal care.  She is currently monitored for the following issues for this low-risk pregnancy and has Supervision of high risk pregnancy, antepartum, third trimester; BMI 38.0-38.9,adult; and Obesity affecting pregnancy, antepartum on their problem list.  Patient reports no complaints.  Contractions: Not present. Vag. Bleeding: None.  Movement: Present. Denies leaking of fluid.   The following portions of the patient's history were reviewed and updated as appropriate: allergies, current medications, past family history, past medical history, past social history, past surgical history and problem list.   Objective:   Vitals:   03/04/24 1440  BP: 121/86  Pulse: (!) 110  Weight: 231 lb 12.8 oz (105.1 kg)    Fetal Status:  Fetal Heart Rate (bpm): 143   Movement: Present    General: Alert, oriented and cooperative. Patient is in no acute distress.  Skin: Skin is warm and dry. No rash noted.   Cardiovascular: Normal heart rate noted  Respiratory: Normal respiratory effort, no problems with respiration noted  Abdomen: Soft, gravid, appropriate for gestational age.  Pain/Pressure: Absent     Pelvic: Cervical exam deferred        Extremities: Normal range of motion.  Edema: None  Mental Status: Normal mood and affect. Normal behavior. Normal judgment and thought content.      02/26/2024    4:47 PM 09/04/2023    4:10 PM  Depression screen PHQ 2/9  Decreased Interest 1 0  Down, Depressed, Hopeless 0 0  PHQ - 2 Score 1 0  Altered sleeping 3 0  Tired, decreased energy 3 1  Change in appetite 0 0  Feeling bad or failure about yourself  0 0  Trouble concentrating 0 0  Moving slowly or fidgety/restless 0 0  Suicidal thoughts 0 0  PHQ-9 Score 7  1      Data saved with a previous flowsheet row definition        02/26/2024    4:59 PM 09/04/2023    4:11 PM  GAD 7 :  Generalized Anxiety Score  Nervous, Anxious, on Edge 1 0  Control/stop worrying 0 0  Worry too much - different things 0 0  Trouble relaxing 0 0  Restless 0 0  Easily annoyed or irritable 1 1  Afraid - awful might happen 0 0  Total GAD 7 Score 2 1    Assessment and Plan:  Pregnancy: G4P1021 at [redacted]w[redacted]d 1. Supervision of high risk pregnancy, antepartum, third trimester (Primary) - on PNV and baby ASA - recheck 1 weeks  2. [redacted] weeks gestation of pregnancy  3. Obesity affecting pregnancy, antepartum, unspecified obesity type  Term labor symptoms and general obstetric precautions including but not limited to vaginal bleeding, contractions, leaking of fluid and fetal movement were reviewed in detail with the patient. Please refer to After Visit Summary for other counseling recommendations.   Return in about 1 week (around 03/11/2024).  Future Appointments  Date Time Provider Department Center  03/10/2024  3:35 PM Tad Arland MARLA, CNM [facility] [id number]  03/17/2024  3:35 PM Lo, Arland MARLA, CNM [facility] [id number]  03/24/2024  3:35 PM Lo, Arland MARLA, CNM [facility] [phone] [facility]    Ronal GORMAN Pinal, MD

## 2024-03-10 ENCOUNTER — Ambulatory Visit (HOSPITAL_BASED_OUTPATIENT_CLINIC_OR_DEPARTMENT_OTHER): Admitting: Certified Nurse Midwife

## 2024-03-10 VITALS — BP 112/71 | HR 104 | Wt 225.8 lb

## 2024-03-10 DIAGNOSIS — O0993 Supervision of high risk pregnancy, unspecified, third trimester: Secondary | ICD-10-CM

## 2024-03-10 DIAGNOSIS — Z3A38 38 weeks gestation of pregnancy: Secondary | ICD-10-CM | POA: Diagnosis not present

## 2024-03-10 DIAGNOSIS — Z6838 Body mass index (BMI) 38.0-38.9, adult: Secondary | ICD-10-CM

## 2024-03-10 DIAGNOSIS — O99213 Obesity complicating pregnancy, third trimester: Secondary | ICD-10-CM | POA: Diagnosis not present

## 2024-03-10 DIAGNOSIS — O99210 Obesity complicating pregnancy, unspecified trimester: Secondary | ICD-10-CM

## 2024-03-10 DIAGNOSIS — E669 Obesity, unspecified: Secondary | ICD-10-CM

## 2024-03-13 NOTE — Progress Notes (Signed)
    PRENATAL VISIT NOTE  Subjective:  Laurie Newman is a 33 y.o. [phone] at [redacted]w[redacted]d being seen today for ongoing prenatal care.  She is currently monitored for the following issues for this  pregnancy and has Supervision of high risk pregnancy, antepartum, third trimester; BMI 38.0-38.9,adult; and Obesity affecting pregnancy, antepartum on their problem list.  Patient reports no bleeding, no contractions, no cramping, and no leaking.  Contractions: Not present. Vag. Bleeding: None.  Movement: Present. Denies leaking of fluid.   The following portions of the patient's history were reviewed and updated as appropriate: allergies, current medications, past family history, past medical history, past social history, past surgical history and problem list.   Objective:   Vitals:   03/10/24 1547  BP: 112/71  Pulse: (!) 104  Weight: 225 lb 12.8 oz (102.4 kg)    Fetal Status:  Fetal Heart Rate (bpm): 140 Fundal Height: 37 cm Movement: Present Presentation: Vertex  General: Alert, oriented and cooperative. Patient is in no acute distress.  Skin: Skin is warm and dry. No rash noted.   Cardiovascular: Normal heart rate noted  Respiratory: Normal respiratory effort, no problems with respiration noted  Abdomen: Soft, gravid, appropriate for gestational age.  Pain/Pressure: Absent     Pelvic: Cervical exam deferred        Extremities: Normal range of motion.  Edema: None  Mental Status: Normal mood and affect. Normal behavior. Normal judgment and thought content.      02/26/2024    4:47 PM 09/04/2023    4:10 PM  Depression screen PHQ 2/9  Decreased Interest 1 0  Down, Depressed, Hopeless 0 0  PHQ - 2 Score 1 0  Altered sleeping 3 0  Tired, decreased energy 3 1  Change in appetite 0 0  Feeling bad or failure about yourself  0 0  Trouble concentrating 0 0  Moving slowly or fidgety/restless 0 0  Suicidal thoughts 0 0  PHQ-9 Score 7  1      Data saved with a previous flowsheet row  definition        02/26/2024    4:59 PM 09/04/2023    4:11 PM  GAD 7 : Generalized Anxiety Score  Nervous, Anxious, on Edge 1 0  Control/stop worrying 0 0  Worry too much - different things 0 0  Trouble relaxing 0 0  Restless 0 0  Easily annoyed or irritable 1 1  Afraid - awful might happen 0 0  Total GAD 7 Score 2 1    Assessment and Plan:  Pregnancy: G4P1021 at [redacted]w[redacted]d  1. Supervision of high risk pregnancy, antepartum, third trimester (Primary) - Taking PNV and ASA - GBS Negative - Feels well - RTO 1 week for return prenatal visit   2. BMI 38.0-38.9,adult - Total wt gain in pregnancy 9lb  3. Obesity affecting pregnancy, antepartum, unspecified obesity type   4. [redacted] weeks gestation of pregnancy   Preterm labor symptoms and general obstetric precautions including but not limited to vaginal bleeding, contractions, leaking of fluid and fetal movement were reviewed in detail with the patient. Please refer to After Visit Summary for other counseling recommendations.   No follow-ups on file.  Future Appointments  Date Time Provider Department Center  03/17/2024  3:35 PM Tad Arland MARLA, CNM [facility] [id number]  03/24/2024  3:35 PM Cole Klugh, Arland MARLA, CNM [facility] [phone] [facility]    Arland MARLA Tad, CNM

## 2024-03-17 ENCOUNTER — Ambulatory Visit (HOSPITAL_BASED_OUTPATIENT_CLINIC_OR_DEPARTMENT_OTHER): Admitting: Certified Nurse Midwife

## 2024-03-17 VITALS — BP 122/81 | HR 118 | Wt 226.4 lb

## 2024-03-17 DIAGNOSIS — E669 Obesity, unspecified: Secondary | ICD-10-CM | POA: Diagnosis not present

## 2024-03-17 DIAGNOSIS — O0993 Supervision of high risk pregnancy, unspecified, third trimester: Secondary | ICD-10-CM

## 2024-03-17 DIAGNOSIS — O99213 Obesity complicating pregnancy, third trimester: Secondary | ICD-10-CM | POA: Diagnosis not present

## 2024-03-17 DIAGNOSIS — Z3A39 39 weeks gestation of pregnancy: Secondary | ICD-10-CM

## 2024-03-17 NOTE — Progress Notes (Signed)
   PRENATAL VISIT NOTE  Subjective:  Laurie Newman is a 33 y.o. [phone] at [redacted]w[redacted]d being seen today for ongoing prenatal care.  She is currently monitored for the following issues for this  pregnancy and has Supervision of high risk pregnancy, antepartum, third trimester; BMI 38.0-38.9,adult; and Obesity affecting pregnancy, antepartum on their problem list.  Patient reports no complaints.  Contractions: Irregular. Vag. Bleeding: None.  Movement: Present. Denies leaking of fluid.   The following portions of the patient's history were reviewed and updated as appropriate: allergies, current medications, past family history, past medical history, past social history, past surgical history and problem list.   Objective:   Vitals:   03/17/24 1607  BP: 122/81  Pulse: (!) 118  Weight: 226 lb 6.4 oz (102.7 kg)    Fetal Status:  Fetal Heart Rate (bpm): 140 Fundal Height: 38 cm Movement: Present Presentation: Vertex  General: Alert, oriented and cooperative. Patient is in no acute distress.  Skin: Skin is warm and dry. No rash noted.   Cardiovascular: Normal heart rate noted  Respiratory: Normal respiratory effort, no problems with respiration noted  Abdomen: Soft, gravid, appropriate for gestational age.  Pain/Pressure: Present     Pelvic: Cervical exam performed in the presence of a chaperone Dilation: 1 Effacement (%): 50    Extremities: Normal range of motion.  Edema: None  Mental Status: Normal mood and affect. Normal behavior. Normal judgment and thought content.      02/26/2024    4:47 PM 09/04/2023    4:10 PM  Depression screen PHQ 2/9  Decreased Interest 1 0  Down, Depressed, Hopeless 0 0  PHQ - 2 Score 1 0  Altered sleeping 3 0  Tired, decreased energy 3 1  Change in appetite 0 0  Feeling bad or failure about yourself  0 0  Trouble concentrating 0 0  Moving slowly or fidgety/restless 0 0  Suicidal thoughts 0 0  PHQ-9 Score 7  1      Data saved with a previous flowsheet  row definition        02/26/2024    4:59 PM 09/04/2023    4:11 PM  GAD 7 : Generalized Anxiety Score  Nervous, Anxious, on Edge 1 0  Control/stop worrying 0 0  Worry too much - different things 0 0  Trouble relaxing 0 0  Restless 0 0  Easily annoyed or irritable 1 1  Afraid - awful might happen 0 0  Total GAD 7 Score 2 1    Assessment and Plan:  Pregnancy: G4P1021 at [redacted]w[redacted]d  1. Supervision of high risk pregnancy, antepartum, third trimester (Primary) - Taking PNV and ASA - Pt requested cervical exam. Declines membrane sweep. - Plans epidural.  2. [redacted] weeks gestation of pregnancy  Term labor symptoms and general obstetric precautions including but not limited to vaginal bleeding, contractions, leaking of fluid and fetal movement were reviewed in detail with the patient. Please refer to After Visit Summary for other counseling recommendations.    Future Appointments  Date Time Provider Department Center  03/24/2024  3:35 PM Shanasia Ibrahim, Arland MARLA, CNM [facility] [phone]    Arland MARLA Roller, CNM

## 2024-03-19 ENCOUNTER — Encounter (HOSPITAL_COMMUNITY): Payer: Self-pay | Admitting: Obstetrics & Gynecology

## 2024-03-19 ENCOUNTER — Inpatient Hospital Stay (HOSPITAL_COMMUNITY): Admitting: Anesthesiology

## 2024-03-19 ENCOUNTER — Encounter (HOSPITAL_COMMUNITY)
Admission: AD | Disposition: A | Discharge status: Home / Self Care | Payer: Self-pay | Source: Home / Self Care | Attending: Obstetrics & Gynecology

## 2024-03-19 ENCOUNTER — Inpatient Hospital Stay (HOSPITAL_COMMUNITY)
Admission: AD | Admit: 2024-03-19 | Discharge: 2024-03-21 | DRG: 788 | Disposition: A | Discharge status: Home / Self Care | Attending: Obstetrics & Gynecology | Admitting: Obstetrics & Gynecology

## 2024-03-19 DIAGNOSIS — O0993 Supervision of high risk pregnancy, unspecified, third trimester: Secondary | ICD-10-CM

## 2024-03-19 DIAGNOSIS — O34211 Maternal care for low transverse scar from previous cesarean delivery: Secondary | ICD-10-CM | POA: Diagnosis present

## 2024-03-19 DIAGNOSIS — Z8249 Family history of ischemic heart disease and other diseases of the circulatory system: Secondary | ICD-10-CM

## 2024-03-19 DIAGNOSIS — O368390 Maternal care for abnormalities of the fetal heart rate or rhythm, unspecified trimester, not applicable or unspecified: Principal | ICD-10-CM

## 2024-03-19 DIAGNOSIS — Z3A39 39 weeks gestation of pregnancy: Secondary | ICD-10-CM

## 2024-03-19 DIAGNOSIS — Z98891 History of uterine scar from previous surgery: Secondary | ICD-10-CM

## 2024-03-19 DIAGNOSIS — Z6838 Body mass index (BMI) 38.0-38.9, adult: Secondary | ICD-10-CM

## 2024-03-19 DIAGNOSIS — Z833 Family history of diabetes mellitus: Secondary | ICD-10-CM

## 2024-03-19 LAB — CBC
HCT: 42.1 % (ref 36.0–46.0)
Hemoglobin: 14.2 g/dL (ref 12.0–15.0)
MCH: 29 pg (ref 26.0–34.0)
MCHC: 33.7 g/dL (ref 30.0–36.0)
MCV: 85.9 fL (ref 80.0–100.0)
Platelets: 335 K/uL (ref 150–400)
RBC: 4.9 MIL/uL (ref 3.87–5.11)
RDW: 14.9 % (ref 11.5–15.5)
WBC: 10.3 K/uL (ref 4.0–10.5)
nRBC: 0 % (ref 0.0–0.2)

## 2024-03-19 LAB — TYPE AND SCREEN
ABO/RH(D): B POS
Antibody Screen: NEGATIVE

## 2024-03-19 LAB — SYPHILIS: RPR W/REFLEX TO RPR TITER AND TREPONEMAL ANTIBODIES, TRADITIONAL SCREENING AND DIAGNOSIS ALGORITHM: RPR Ser Ql: NONREACTIVE

## 2024-03-19 SURGERY — Surgical Case
Anesthesia: Epidural

## 2024-03-19 MED ORDER — LACTATED RINGERS IV SOLN: INTRAVENOUS | Status: DC

## 2024-03-19 MED ORDER — ACETAMINOPHEN 10 MG/ML IV SOLN
INTRAVENOUS | Status: DC | PRN
  Administered 2024-03-19: 1000 mg via INTRAVENOUS

## 2024-03-19 MED ORDER — PHENYLEPHRINE 80 MCG/ML (10ML) SYRINGE FOR IV PUSH (FOR BLOOD PRESSURE SUPPORT): 80.0000 ug | PREFILLED_SYRINGE | INTRAVENOUS | Status: DC | PRN

## 2024-03-19 MED ORDER — KETOROLAC TROMETHAMINE 30 MG/ML IJ SOLN: 30.0000 mg | Freq: Four times a day (QID) | INTRAMUSCULAR | Status: AC | PRN

## 2024-03-19 MED ORDER — METHYLERGONOVINE MALEATE 0.2 MG/ML IJ SOLN
INTRAMUSCULAR | Status: AC
  Filled 2024-03-19: qty 1

## 2024-03-19 MED ORDER — OXYTOCIN-SODIUM CHLORIDE 30-0.9 UT/500ML-% IV SOLN
INTRAVENOUS | Status: DC | PRN
  Administered 2024-03-19: 30 [IU] via INTRAVENOUS

## 2024-03-19 MED ORDER — TRANEXAMIC ACID-NACL 1000-0.7 MG/100ML-% IV SOLN
INTRAVENOUS | Status: DC | PRN
  Administered 2024-03-19: 1000 mg via INTRAVENOUS

## 2024-03-19 MED ORDER — FENTANYL-BUPIVACAINE-NACL 0.5-0.125-0.9 MG/250ML-% EP SOLN
EPIDURAL | Status: AC
  Filled 2024-03-19: qty 250

## 2024-03-19 MED ORDER — ONDANSETRON HCL 4 MG/2ML IJ SOLN: 4.0000 mg | Freq: Three times a day (TID) | INTRAMUSCULAR | Status: DC | PRN

## 2024-03-19 MED ORDER — ACETAMINOPHEN 325 MG PO TABS: 650.0000 mg | ORAL_TABLET | ORAL | Status: DC | PRN

## 2024-03-19 MED ORDER — PROMETHAZINE (PHENERGAN) 6.25MG IN NS 50ML IVPB: 6.2500 mg | INTRAVENOUS | Status: DC | PRN

## 2024-03-19 MED ORDER — DIPHENHYDRAMINE HCL 50 MG/ML IJ SOLN: 12.5000 mg | Freq: Four times a day (QID) | INTRAMUSCULAR | Status: DC | PRN

## 2024-03-19 MED ORDER — LIDOCAINE HCL (PF) 1 % IJ SOLN
INTRAMUSCULAR | Status: DC | PRN
  Administered 2024-03-19: 11 mL via EPIDURAL

## 2024-03-19 MED ORDER — EPHEDRINE 5 MG/ML INJ: 10.0000 mg | INTRAVENOUS | Status: DC | PRN

## 2024-03-19 MED ORDER — IBUPROFEN 600 MG PO TABS
600.0000 mg | ORAL_TABLET | Freq: Four times a day (QID) | ORAL | Status: DC
  Administered 2024-03-20 – 2024-03-21 (×3): 600 mg via ORAL
  Filled 2024-03-19 (×3): qty 1

## 2024-03-19 MED ORDER — TERBUTALINE SULFATE 1 MG/ML IJ SOLN: 0.2500 mg | Freq: Once | INTRAMUSCULAR | Status: DC | PRN

## 2024-03-19 MED ORDER — DEXAMETHASONE SOD PHOSPHATE PF 10 MG/ML IJ SOLN
INTRAMUSCULAR | Status: DC | PRN
  Administered 2024-03-19: 10 mg via INTRAVENOUS

## 2024-03-19 MED ORDER — LIDOCAINE-EPINEPHRINE (PF) 2 %-1:200000 IJ SOLN
INTRAMUSCULAR | Status: DC | PRN
  Administered 2024-03-19 (×2): 5 mL via EPIDURAL

## 2024-03-19 MED ORDER — COCONUT OIL OIL: 1.0000 | TOPICAL_OIL | Status: DC | PRN

## 2024-03-19 MED ORDER — ONDANSETRON HCL 4 MG/2ML IJ SOLN
INTRAMUSCULAR | Status: AC
  Filled 2024-03-19: qty 2

## 2024-03-19 MED ORDER — FENTANYL CITRATE (PF) 100 MCG/2ML IJ SOLN: 25.0000 ug | INTRAMUSCULAR | Status: DC | PRN

## 2024-03-19 MED ORDER — FLEET ENEMA RE ENEM: 1.0000 | ENEMA | RECTAL | Status: DC | PRN

## 2024-03-19 MED ORDER — DIPHENHYDRAMINE HCL 25 MG PO CAPS: 25.0000 mg | ORAL_CAPSULE | Freq: Four times a day (QID) | ORAL | Status: DC | PRN

## 2024-03-19 MED ORDER — OXYTOCIN-SODIUM CHLORIDE 30-0.9 UT/500ML-% IV SOLN
1.0000 m[IU]/min | INTRAVENOUS | Status: DC
  Administered 2024-03-19: 1 m[IU]/min via INTRAVENOUS

## 2024-03-19 MED ORDER — METHYLERGONOVINE MALEATE 0.2 MG/ML IJ SOLN
INTRAMUSCULAR | Status: DC | PRN
  Administered 2024-03-19: .2 mg via INTRAMUSCULAR

## 2024-03-19 MED ORDER — LACTATED RINGERS IV SOLN
500.0000 mL | INTRAVENOUS | Status: DC | PRN
  Administered 2024-03-19: 500 mL via INTRAVENOUS

## 2024-03-19 MED ORDER — OXYTOCIN-SODIUM CHLORIDE 30-0.9 UT/500ML-% IV SOLN
INTRAVENOUS | Status: AC
  Filled 2024-03-19: qty 500

## 2024-03-19 MED ORDER — SENNOSIDES-DOCUSATE SODIUM 8.6-50 MG PO TABS
2.0000 | ORAL_TABLET | ORAL | Status: DC
  Administered 2024-03-20 – 2024-03-21 (×2): 2 via ORAL
  Filled 2024-03-19 (×2): qty 2

## 2024-03-19 MED ORDER — ACETAMINOPHEN 10 MG/ML IV SOLN
INTRAVENOUS | Status: AC
  Filled 2024-03-19: qty 100

## 2024-03-19 MED ORDER — ONDANSETRON HCL 4 MG/2ML IJ SOLN
INTRAMUSCULAR | Status: DC | PRN
  Administered 2024-03-19: 4 mg via INTRAVENOUS

## 2024-03-19 MED ORDER — OXYTOCIN-SODIUM CHLORIDE 30-0.9 UT/500ML-% IV SOLN
2.5000 [IU]/h | INTRAVENOUS | Status: AC
  Administered 2024-03-19: 2.5 [IU]/h via INTRAVENOUS

## 2024-03-19 MED ORDER — SODIUM CHLORIDE 0.9% FLUSH: 3.0000 mL | INTRAVENOUS | Status: DC | PRN

## 2024-03-19 MED ORDER — CEFAZOLIN SODIUM-DEXTROSE 2-4 GM/100ML-% IV SOLN
INTRAVENOUS | Status: AC
  Filled 2024-03-19: qty 100

## 2024-03-19 MED ORDER — OXYTOCIN-SODIUM CHLORIDE 30-0.9 UT/500ML-% IV SOLN
2.5000 [IU]/h | INTRAVENOUS | Status: DC
  Filled 2024-03-19: qty 500

## 2024-03-19 MED ORDER — FENTANYL-BUPIVACAINE-NACL 0.5-0.125-0.9 MG/250ML-% EP SOLN
12.0000 mL/h | EPIDURAL | Status: DC | PRN
  Administered 2024-03-19: 12 mL/h via EPIDURAL

## 2024-03-19 MED ORDER — MORPHINE SULFATE (PF) 0.5 MG/ML IJ SOLN
INTRAMUSCULAR | Status: AC
  Filled 2024-03-19: qty 10

## 2024-03-19 MED ORDER — HYDROCODONE-ACETAMINOPHEN 5-325 MG PO TABS
1.0000 | ORAL_TABLET | ORAL | Status: DC | PRN
  Administered 2024-03-21 (×2): 1 via ORAL
  Administered 2024-03-21: 2 via ORAL
  Filled 2024-03-19: qty 2
  Filled 2024-03-19 (×2): qty 1

## 2024-03-19 MED ORDER — CEFAZOLIN SODIUM-DEXTROSE 2-3 GM-%(50ML) IV SOLR
INTRAVENOUS | Status: DC | PRN
  Administered 2024-03-19: 2 g via INTRAVENOUS

## 2024-03-19 MED ORDER — OXYTOCIN BOLUS FROM INFUSION: 333.0000 mL | Freq: Once | INTRAVENOUS | Status: DC

## 2024-03-19 MED ORDER — PRENATAL MULTIVITAMIN CH
1.0000 | ORAL_TABLET | Freq: Every day | ORAL | Status: DC
  Administered 2024-03-20 – 2024-03-21 (×2): 1 via ORAL
  Filled 2024-03-19 (×2): qty 1

## 2024-03-19 MED ORDER — TERBUTALINE SULFATE 1 MG/ML IJ SOLN
INTRAMUSCULAR | Status: AC
  Filled 2024-03-19: qty 1

## 2024-03-19 MED ORDER — LIDOCAINE HCL (PF) 1 % IJ SOLN: 30.0000 mL | INTRAMUSCULAR | Status: DC | PRN

## 2024-03-19 MED ORDER — SODIUM CHLORIDE 0.9 % IR SOLN
Status: DC | PRN
  Administered 2024-03-19 (×2): 1

## 2024-03-19 MED ORDER — ONDANSETRON HCL 4 MG/2ML IJ SOLN: 4.0000 mg | Freq: Four times a day (QID) | INTRAMUSCULAR | Status: DC | PRN

## 2024-03-19 MED ORDER — MENTHOL 3 MG MT LOZG: 1.0000 | LOZENGE | OROMUCOSAL | Status: DC | PRN

## 2024-03-19 MED ORDER — SIMETHICONE 80 MG PO CHEW
80.0000 mg | CHEWABLE_TABLET | ORAL | Status: DC | PRN
Start: 2024-03-19 — End: 2024-03-21

## 2024-03-19 MED ORDER — TERBUTALINE SULFATE 1 MG/ML IJ SOLN
0.2500 mg | Freq: Once | INTRAMUSCULAR | Status: AC
  Administered 2024-03-19: 0.25 mg via SUBCUTANEOUS

## 2024-03-19 MED ORDER — KETOROLAC TROMETHAMINE 30 MG/ML IJ SOLN
INTRAMUSCULAR | Status: AC
Start: 2024-03-19 — End: 2024-03-19
  Filled 2024-03-19: qty 1

## 2024-03-19 MED ORDER — OXYCODONE-ACETAMINOPHEN 5-325 MG PO TABS: 1.0000 | ORAL_TABLET | ORAL | Status: DC | PRN

## 2024-03-19 MED ORDER — STERILE WATER FOR IRRIGATION IR SOLN
Status: DC | PRN
  Administered 2024-03-19: 1000 mL

## 2024-03-19 MED ORDER — DIBUCAINE (PERIANAL) 1 % EX OINT: 1.0000 | TOPICAL_OINTMENT | CUTANEOUS | Status: DC | PRN

## 2024-03-19 MED ORDER — SIMETHICONE 80 MG PO CHEW
80.0000 mg | CHEWABLE_TABLET | Freq: Three times a day (TID) | ORAL | Status: DC
  Administered 2024-03-20 – 2024-03-21 (×3): 80 mg via ORAL
  Filled 2024-03-19 (×3): qty 1

## 2024-03-19 MED ORDER — ACETAMINOPHEN 500 MG PO TABS
1000.0000 mg | ORAL_TABLET | Freq: Four times a day (QID) | ORAL | Status: DC
  Administered 2024-03-20 (×3): 1000 mg via ORAL
  Filled 2024-03-19 (×3): qty 2

## 2024-03-19 MED ORDER — NALOXONE HCL 0.4 MG/ML IJ SOLN: 0.4000 mg | INTRAMUSCULAR | Status: DC | PRN

## 2024-03-19 MED ORDER — ENOXAPARIN SODIUM 60 MG/0.6ML IJ SOSY
50.0000 mg | PREFILLED_SYRINGE | INTRAMUSCULAR | Status: DC
  Administered 2024-03-20: 50 mg via SUBCUTANEOUS
  Filled 2024-03-19 (×2): qty 0.6

## 2024-03-19 MED ORDER — TRANEXAMIC ACID-NACL 1000-0.7 MG/100ML-% IV SOLN
INTRAVENOUS | Status: AC
  Filled 2024-03-19: qty 100

## 2024-03-19 MED ORDER — WITCH HAZEL-GLYCERIN EX PADS: 1.0000 | MEDICATED_PAD | CUTANEOUS | Status: DC | PRN

## 2024-03-19 MED ORDER — LACTATED RINGERS IV SOLN
500.0000 mL | Freq: Once | INTRAVENOUS | Status: AC
  Administered 2024-03-19: 500 mL via INTRAVENOUS

## 2024-03-19 MED ORDER — AZITHROMYCIN 500 MG IV SOLR
INTRAVENOUS | Status: DC | PRN
  Administered 2024-03-19: 500 mg via INTRAVENOUS

## 2024-03-19 MED ORDER — SOD CITRATE-CITRIC ACID 500-334 MG/5ML PO SOLN
30.0000 mL | ORAL | Status: DC | PRN
  Administered 2024-03-19: 30 mL via ORAL
  Filled 2024-03-19: qty 30

## 2024-03-19 MED ORDER — OXYCODONE-ACETAMINOPHEN 5-325 MG PO TABS: 2.0000 | ORAL_TABLET | ORAL | Status: DC | PRN

## 2024-03-19 MED ORDER — MORPHINE SULFATE (PF) 0.5 MG/ML IJ SOLN
INTRAMUSCULAR | Status: DC | PRN
  Administered 2024-03-19: 3 mg via EPIDURAL

## 2024-03-19 MED ORDER — KETOROLAC TROMETHAMINE 30 MG/ML IJ SOLN
30.0000 mg | Freq: Once | INTRAMUSCULAR | Status: AC | PRN
  Administered 2024-03-19: 30 mg via INTRAVENOUS

## 2024-03-19 MED ORDER — KETOROLAC TROMETHAMINE 30 MG/ML IJ SOLN
30.0000 mg | Freq: Four times a day (QID) | INTRAMUSCULAR | Status: AC
  Administered 2024-03-20 (×3): 30 mg via INTRAVENOUS
  Filled 2024-03-19 (×3): qty 1

## 2024-03-19 MED ORDER — DIPHENHYDRAMINE HCL 50 MG/ML IJ SOLN: 12.5000 mg | INTRAMUSCULAR | Status: DC | PRN

## 2024-03-19 SURGICAL SUPPLY — 33 items
BENZOIN TINCTURE PRP APPL 2/3 (GAUZE/BANDAGES/DRESSINGS) IMPLANT
CHLORAPREP W/TINT 26 (MISCELLANEOUS) ×2 IMPLANT
CLAMP UMBILICAL CORD (MISCELLANEOUS) ×1 IMPLANT
CLOTH BEACON ORANGE TIMEOUT ST (SAFETY) ×1 IMPLANT
DRSG OPSITE POSTOP 4X10 (GAUZE/BANDAGES/DRESSINGS) ×1 IMPLANT
ELECTRODE REM PT RTRN 9FT ADLT (ELECTROSURGICAL) ×1 IMPLANT
EXTRACTOR VACUUM KIWI (MISCELLANEOUS) IMPLANT
GAUZE PAD ABD 7.5X8 STRL (GAUZE/BANDAGES/DRESSINGS) IMPLANT
GAUZE SPONGE 4X4 12PLY STRL LF (GAUZE/BANDAGES/DRESSINGS) IMPLANT
GLOVE BIOGEL PI IND STRL 7.0 (GLOVE) ×2 IMPLANT
GLOVE ECLIPSE 7.0 STRL STRAW (GLOVE) ×1 IMPLANT
GOWN STRL REUS W/TWL LRG LVL3 (GOWN DISPOSABLE) ×2 IMPLANT
HEMOSTAT ARISTA ABSORB 3G PWDR (HEMOSTASIS) IMPLANT
KIT ABG SYR 3ML LUER SLIP (SYRINGE) ×1 IMPLANT
MAT PREVALON FULL STRYKER (MISCELLANEOUS) IMPLANT
NDL HYPO 25X5/8 SAFETYGLIDE (NEEDLE) ×1 IMPLANT
NS IRRIG 1000ML POUR BTL (IV SOLUTION) ×1 IMPLANT
PACK C SECTION WH (CUSTOM PROCEDURE TRAY) ×1 IMPLANT
PAD OB MATERNITY 4.3X12.25 (PERSONAL CARE ITEMS) ×1 IMPLANT
RTRCTR C-SECT PINK 25CM LRG (MISCELLANEOUS) ×1 IMPLANT
STRIP CLOSURE SKIN 1/2X4 (GAUZE/BANDAGES/DRESSINGS) IMPLANT
SUT MNCRL 0 VIOLET CTX 36 (SUTURE) ×1 IMPLANT
SUT PLAIN 0 NONE (SUTURE) IMPLANT
SUT PLAIN ABS 2-0 CT1 27XMFL (SUTURE) IMPLANT
SUT VIC AB 0 CTX36XBRD ANBCTRL (SUTURE) ×1 IMPLANT
SUT VIC AB 2-0 CT1 (SUTURE) IMPLANT
SUT VIC AB 2-0 CT1 TAPERPNT 27 (SUTURE) IMPLANT
SUT VIC AB 4-0 KS 27 (SUTURE) ×1 IMPLANT
SUTURE PLAIN GUT 2.0 ETHICON (SUTURE) IMPLANT
TAPE CLOTH SURG 4X10 WHT LF (GAUZE/BANDAGES/DRESSINGS) IMPLANT
TOWEL OR 17X24 6PK STRL BLUE (TOWEL DISPOSABLE) ×1 IMPLANT
TRAY FOLEY W/BAG SLVR 14FR LF (SET/KITS/TRAYS/PACK) IMPLANT
WATER STERILE IRR 1000ML POUR (IV SOLUTION) ×1 IMPLANT

## 2024-03-19 NOTE — Progress Notes (Signed)
 LABOR PROGRESS NOTE Called to room at [facility] for decel. Position changes initiated and IVF bolus running. SCE 7.5cm ago.   Recheck patient on my arrival, still 7cm, more cervix present on left.  Terb x1 with minimal improvement in FHR. Recheck, 7-8cm, L cervix reducing. More position changes.  Dr. Jayne called to bedside, 8-9cm on his exam, cervix still present on left.   FHR improving, still contracting spontaneously.   Barabara Maier, DO 7:34 AM

## 2024-03-19 NOTE — Transfer of Care (Signed)
 Immediate Anesthesia Transfer of Care Note  Patient: Laurie Newman  Procedure(s) Performed: CESAREAN DELIVERY  Patient Location: PACU  Anesthesia Type:Epidural  Level of Consciousness: awake, alert , and oriented  Airway & Oxygen Therapy: Patient Spontanous Breathing  Post-op Assessment: Report given to RN and Post -op Vital signs reviewed and stable  Post vital signs: Reviewed and stable  Last Vitals:  Vitals Value Taken Time  BP 127/91 03/19/24 16:27  Temp    Pulse 116 03/19/24 16:30  Resp 16 03/19/24 16:30  SpO2 98 % 03/19/24 16:30  Vitals shown include unfiled device data.  Last Pain:  Vitals:   03/19/24 1220  TempSrc: Oral  PainSc:          Complications: No notable events documented.

## 2024-03-19 NOTE — Op Note (Signed)
 Cesarean Section Operative Note   Patient: Laurie Newman  Date of Procedure: 03/19/2024  Procedure: Primary Low Transverse Cesarean   Indications: failure to progress: arrest of dilation at 10 cm and non-reassuring fetal status  Pre-operative Diagnosis: Primary Cesarean section for failure to progress and non reassuring fetal heart rate.   Post-operative Diagnosis: Same  TOLAC Candidate: Yes   Surgeon: Surgeons and Role:    * Lola Laurie HERO, MD - Primary    * Zina Jerilynn LABOR, MD - Assisting  An experienced assistant was required given the standard of surgical care given the complexity of the case.  This assistant was needed for exposure, dissection, suctioning, retraction, instrument exchange, assisting with delivery with administration of fundal pressure, and for overall help during the procedure.   Anesthesia: epidural  Anesthesiologist: Laurie Delon Brunswick, MD   Antibiotics: Cefazolin  and Azithromycin    Estimated Blood Loss: 270 ml   Total IV Fluids: 1500 ml  Urine Output: 200 cc OF clear urine  Specimens: placenta to pathology   Complications: no complications   Indications: Laurie Newman is a 33 y.o. [id number] with an IUP [redacted]w[redacted]d presenting for unscheduled, urgent cesarean secondary to the indications listed above. Clinical course notable for arrival in spontaneous labor at 5 cm. She was augmented with AROM at 6 cm and then stalled out at 9 cm for about 4 hours. She was augmented with pitocin  but did not descend pass 0 station and baby did not tolerate pushing well with deep variable decelerations and prolonged decelerations. She was recommended to have a cesarean around noon but declined and preferred to try and labor down and push again. This was attempted but no progress was made past 0 station and cesarean was again recommended and agreed to around 1500.  The risks of cesarean section discussed with the patient included but were not limited to: bleeding  which may require transfusion or reoperation; infection which may require antibiotics; injury to bowel, bladder, ureters or other surrounding organs; injury to the fetus; need for additional procedures including hysterectomy in the event of a life-threatening hemorrhage; placental abnormalities with subsequent pregnancies, incisional problems, thromboembolic phenomenon and other postoperative/anesthesia complications. The patient concurred with the proposed plan, giving informed written consent for the procedure. Patient NPO status waived given urgency of case. Anesthesia and OR aware. Preoperative prophylactic antibiotics and SCDs ordered on call to the OR.   Findings: Viable infant in cephalic presentation (LOP, neck quite extended), nuchal x1. Apgars 7, 8, . Weight 3300 g. Heavy meconium stained amniotic fluid. Normal placenta, three vessel cord. Normal uterus, Normal bilateral fallopian tubes, Normal bilateral ovaries. No adhesive disease was encountered.  Procedure Details: A Time Out was held and the above information confirmed. The patient received intravenous antibiotics and had sequential compression devices applied to her lower extremities preoperatively. The patient was taken back to the operative suite where epidural anesthesia was administered and dosed up to a surgical level. After induction of anesthesia, the patient was draped and prepped in the usual sterile manner and placed in a dorsal supine position with a leftward tilt. A low transverse skin incision was made with scalpel and carried down through the subcutaneous tissue to the fascia. Fascial incision was made and extended transversely. The fascia was separated from the underlying rectus tissue superiorly and inferiorly. The rectus muscles were separated in the midline bluntly and the peritoneum was entered bluntly. An Alexis retractor was placed to aid in visualization of the uterus. A bladder flap was  not developed. A low transverse  uterine incision was made with immediate expulsion of heavily meconium stained fluid. The infant was successfully delivered from cephalic presentation, the umbilical cord was clamped after 1 minute. Cord ph was not sent, and cord blood was obtained for evaluation. The placenta was removed Intact and appeared normal. The uterine incision was closed with a single layer running unlocked suture of 0-Monocryl. In addition, a layer of Arista was placed. Overall, excellent hemostasis was noted. The abdomen and the pelvis were cleared of all clot and debris and the Thersia was removed. Hemostasis was confirmed on all surfaces.  The peritoneum was reapproximated using 2-0 vicryl . The fascia was then closed using 0 Vicryl in a running fashion. The subcutaneous layer was reapproximated with 2-0 plain gut suture. The skin was closed with a 4-0 vicryl subcuticular stitch. The patient tolerated the procedure well. Sponge, lap, instrument and needle counts were correct x 2. She was taken to the recovery room in stable condition.  Disposition: PACU - hemodynamically stable.    Signed: [name], Laurie Newman Attending Family Medicine Physician, [facility] for [facility], [facility]

## 2024-03-19 NOTE — Discharge Summary (Signed)
 Postpartum Discharge Summary    Patient Name: Laurie Newman DOB: [birth year] MRN: [id number]  Date of admission: 03/19/2024 Delivery date:03/19/2024 Delivering provider: ECKSTAT, MATTHEW M Date of discharge: 03/21/2024  Admitting diagnosis: Labor and delivery, indication for care [O75.9] Intrauterine pregnancy: [redacted]w[redacted]d     Secondary diagnosis:  Active Problems:   S/P cesarean section  Additional problems: none    Discharge diagnosis: Term Pregnancy Delivered                                              Post partum procedures:none Augmentation: N/A Complications: None  Hospital course: Induction of Labor With Cesarean Section   33 y.o. yo [id number] at [redacted]w[redacted]d was admitted to the hospital 03/19/2024 for active labor with fetal heart rate decelerations. Patient had a labor course significant for NRFHT. The patient went for cesarean section due to Non-Reassuring FHR. Delivery details are as follows: Membrane Rupture Time/Date: 5:41 AM,03/19/2024  Delivery Method:C-Section, Low Transverse Operative Delivery:N/A Details of operation can be found in separate operative Note.  Patient had a postpartum course complicated by: none. She is ambulating, tolerating a regular diet, passing flatus, and urinating well.  Patient is discharged home in stable condition on 03/21/24.      Newborn Data: Birth date:03/19/2024 Birth time:3:32 PM Gender:Female Living status:Living Apgars:7 ,8  Weight:3300 g                               Magnesium Sulfate received: No BMZ received: No Rhophylac:N/A MMR:N/A T-DaP:Given prenatally Flu: declined RSV Vaccine received: given prenatally Transfusion:No  Immunizations received: Immunization History  Administered Date(s) Administered    sv, Bivalent, Protein Subunit Rsvpref,pf (Abrysvo ) 01/28/2024   Influenza, Seasonal, Injecte, Preservative Fre 02/22/2024   Influenza,inj,Quad PF,6+ Mos 01/22/2017   Tdap 11/19/2019, 01/14/2024    Physical exam  Vitals:    03/20/24 0830 03/20/24 1227 03/20/24 2217 03/21/24 0539  BP: 112/73 (!) 118/53 103/73 118/82  Pulse: (!) 111 (!) 108 (!) 107 (!) 107  Resp: 18 18 18 18   Temp: 98.2 F (36.8 C) 97.9 F (36.6 C) 97.9 F (36.6 C) 97.7 F (36.5 C)  TempSrc: Oral Oral Oral Oral  SpO2: 100% 98% 100% 98%  Weight:      Height:       General: alert, cooperative, and no distress Lochia: appropriate Uterine Fundus: firm Incision: Dressing is clean, dry, and intact DVT Evaluation: No significant calf/ankle edema. Labs: Lab Results  Component Value Date   WBC 19.1 (H) 03/20/2024   HGB 12.5 03/20/2024   HCT 36.6 03/20/2024   MCV 85.1 03/20/2024   PLT 267 03/20/2024       No data to display         Edinburgh Score:    03/20/2024    3:51 AM  Edinburgh Postnatal Depression Scale Screening Tool  I have been able to laugh and see the funny side of things. --   No data recorded  After visit meds:  Allergies as of 03/21/2024   No Known Allergies      Medication List     STOP taking these medications    aspirin  EC 81 MG tablet       TAKE these medications    acetaminophen  500 MG tablet Commonly known as: TYLENOL  Take 2 tablets (1,000 mg total) by mouth every  6 (six) hours.   HYDROcodone -acetaminophen  5-325 MG tablet Commonly known as: NORCO/VICODIN Take 1-2 tablets by mouth every 4 (four) hours as needed for moderate pain (pain score 4-6).   ibuprofen  600 MG tablet Commonly known as: ADVIL  Take 1 tablet (600 mg total) by mouth every 6 (six) hours.   multivitamin-prenatal 27-0.8 MG Tabs tablet Take 1 tablet/capsule daily   senna-docusate 8.6-50 MG tablet Commonly known as: Senokot-S Take 2 tablets by mouth at bedtime as needed for mild constipation.   triamcinolone  cream 0.5 % Commonly known as: KENALOG  Apply 1 Application topically 3 (three) times daily.         Discharge home in stable condition Infant Feeding: Breast Infant Disposition:home with  mother Discharge instruction: per After Visit Summary and Postpartum booklet. Activity: Advance as tolerated. Pelvic rest for 6 weeks.  Diet: routine diet Future Appointments: Future Appointments  Date Time Provider Department Center  03/24/2024  3:35 PM Lo, Arland POUR, CNM [facility] [phone] [facility]   Follow up Visit:   Please schedule this patient for a In person postpartum visit in 6 weeks with the following provider: Any provider. Additional Postpartum F/U:Incision check 1 week  Low risk pregnancy complicated by: none Delivery mode:  C-Section, Low Transverse Anticipated Birth Control:  declines   03/21/2024 Leeroy KATHEE Pouch, MD

## 2024-03-19 NOTE — MAU Note (Signed)
 MAU Labor Triage Note:  .Laurie Newman is a 33 y.o. at [redacted]w[redacted]d here in MAU reporting:  Contractions every: 3-4 minutes Onset of ctx: 8pm    ROM: intact Vaginal Bleeding: none Last SVE: 1cm Labor Pain Management Plan: Planning epidural  GBS: Negative  Fetal Movement: Reports positive FM FHT:  145  Vitals:   03/19/24 0214  BP: 133/85  Pulse: (!) 117  Resp: 17  Temp: 98.2 F (36.8 C)  SpO2: 100%      Lab orders placed from triage: MAU Labor Eval OB Office: Faculty

## 2024-03-19 NOTE — Progress Notes (Signed)
 LABOR PROGRESS NOTE In to meet the patient, rechecked at 0540 Patient comfortable with epidural. Pit at none. SCE: 6/90/-1 AROM cf  FHT: baseline 130, mod variability, +accels, -decels; overall category I. Toco: q3 min subtle  A/P:  Continue spontaneous labor, augment PRN  Barabara Maier, DO 5:49 AM

## 2024-03-19 NOTE — Progress Notes (Signed)
 LABOR PROGRESS NOTE  CHELLA CHAPDELAINE is a 33 y.o. [phone] at [redacted]w[redacted]d  admitted for spontaneous labor and Cat II tracing.  Subjective: Feeling frustrated by lack of progress  Objective: BP 113/64   Pulse (!) 108   Temp 98 F (36.7 C) (Oral)   Resp 18   Ht 5' 2 (1.575 m)   Wt 102.6 kg   LMP 06/17/2023   SpO2 100%   BMI 41.37 kg/m  or  Vitals:   03/19/24 1103 03/19/24 1220 03/19/24 1300 03/19/24 1332  BP: 121/62 134/60 (!) 100/58 113/64  Pulse: (!) 128 (!) 144 (!) 118 (!) 108  Resp:    18  Temp:  98 F (36.7 C)    TempSrc:  Oral    SpO2:      Weight:      Height:        Physical Exam Vitals reviewed.  Constitutional:      General: She is not in acute distress.    Appearance: She is well-developed. She is not diaphoretic.  Cardiovascular:     Rate and Rhythm: Normal rate and regular rhythm.     Heart sounds: Normal heart sounds. No murmur heard. Pulmonary:     Effort: Pulmonary effort is normal. No respiratory distress.     Breath sounds: Normal breath sounds. No wheezing or rales.  Abdominal:     General: Bowel sounds are normal. There is no distension.     Palpations: Abdomen is soft.     Tenderness: There is no abdominal tenderness. There is no guarding or rebound.  Skin:    General: Skin is warm and dry.  Neurological:     Mental Status: She is alert.     Coordination: Coordination normal.      Dilation: 10 Dilation Complete Date: 03/19/24 Dilation Complete Time: 1429 Effacement (%): 100 Station: 0 Presentation: Vertex Exam by:: v smith,cnm  FHT Baseline: 145 bpm Variability: Good {> 6 bpm) Accelerations: absent Decelerations: prolonged and multiple variables Uterine activity: regular q2 min Cat: II  Labs: Lab Results  Component Value Date   WBC 10.3 03/19/2024   HGB 14.2 03/19/2024   HCT 42.1 03/19/2024   MCV 85.9 03/19/2024   PLT 335 03/19/2024    Patient Active Problem List   Diagnosis Date Noted   Labor and delivery, indication  for care 03/19/2024   Obesity affecting pregnancy, antepartum 10/25/2023   BMI 38.0-38.9,adult 09/05/2023   Supervision of high risk pregnancy, antepartum, third trimester 09/04/2023    Assessment / Plan: 33 y.o. G4P1021 at [redacted]w[redacted]d here for spontaneous labor and cat II tracing.  Labor: patient remains at 0 station and continues to have prolonged decelerations with pushing.   Discussed with patient again that I recommend cesarean. After five minutes of discussion with her partner she is in agreement.   The risks of cesarean section discussed with the patient included but were not limited to: bleeding which may require transfusion or reoperation; infection which may require antibiotics; injury to bowel, bladder, ureters or other surrounding organs; injury to the fetus; need for additional procedures including hysterectomy in the event of a life-threatening hemorrhage; placental abnormalities with subsequent pregnancies, incisional problems, thromboembolic phenomenon and other postoperative/anesthesia complications. The patient concurred with the proposed plan, giving informed written consent for the procedure. Patient has been NPO since last night she will remain NPO for procedure. Anesthesia and OR aware. Preoperative prophylactic antibiotics and SCDs ordered on call to the OR.   Fetal Wellbeing:  Cat II  tracing Pain Control:  epidural, working well GBS: neg Anticipated MOD: cesarean   [name], MD/MPH Attending Family Medicine Physician, [facility] for [facility], [facility]   03/19/2024, 2:56 PM

## 2024-03-19 NOTE — Lactation Note (Signed)
 This note was copied from a baby's chart. Lactation Consultation Note  Patient Name: Laurie Newman Unijb'd Date: 03/19/2024 Age:31 hours Reason for consult: Initial assessment;1st time breastfeeding;Exclusive pumping and bottle feeding;Term  P2- MOB wants to exclusively pump only. No latching to the breast. RN had already set up MOB with the hospital DEBP. LC sized MOB at an 18 mm flange. LC reviewed how to use the pump and how to clean it. LC reviewed the difference between colostrum vs mature milk. LC reviewed the importance of pumping every 3 hrs to ensure proper breast stimulation. MOB denied having any questions or concerns at this time. MOB did not want to pump with LC during this consult.  LC reviewed the first 24 hr birthday nap, day 2 cluster feeding, feeding infant on cue 8-12x in 24 hrs, not allowing infant to go over 3 hrs without a feeding, CDC milk storage guidelines, LC services handout and engorgement/breast care. LC encouraged MOB to call for further assistance as needed.  Maternal Data Has patient been taught Hand Expression?: No Does the patient have breastfeeding experience prior to this delivery?: No  Feeding Mother's Current Feeding Choice: Breast Milk (exclusive pumping) Nipple Type: Slow - flow  Lactation Tools Discussed/Used Tools: Pump;Flanges Flange Size: 18 Breast pump type: Double-Electric Breast Pump;Manual Pump Education: Setup, frequency, and cleaning;Milk Storage Reason for Pumping: exclusive pumping Pumping frequency: 15-20 min every 3 hrs  Interventions Interventions: Breast feeding basics reviewed;Hand pump;DEBP;Education;LC Services brochure  Discharge Discharge Education: Engorgement and breast care;Warning signs for feeding baby Pump: DEBP;Personal (Spectra )  Consult Status Consult Status: Follow-up Date: 03/20/24 Follow-up type: In-patient    Recardo Hoit BS, IBCLC 03/19/2024, 7:08 PM

## 2024-03-19 NOTE — Progress Notes (Signed)
 LABOR PROGRESS NOTE  KATHIA COVINGTON is a 33 y.o. [phone] at [redacted]w[redacted]d  admitted for spontaneous labor and Cat II tracing.  Subjective: Wanting to try pushing  Objective: BP 121/62   Pulse (!) 128   Temp 98.5 F (36.9 C) (Oral)   Resp 16   Ht 5' 2 (1.575 m)   Wt 102.6 kg   LMP 06/17/2023   SpO2 100%   BMI 41.37 kg/m  or  Vitals:   03/19/24 0902 03/19/24 0932 03/19/24 1002 03/19/24 1103  BP: 103/73 116/85 104/67 121/62  Pulse: (!) 113 (!) 116 (!) 115 (!) 128  Resp:      Temp:      TempSrc:      SpO2:      Weight:      Height:        Physical Exam Vitals reviewed.  Constitutional:      General: She is not in acute distress.    Appearance: She is well-developed. She is not diaphoretic.  Cardiovascular:     Rate and Rhythm: Normal rate and regular rhythm.     Heart sounds: Normal heart sounds. No murmur heard. Pulmonary:     Effort: Pulmonary effort is normal. No respiratory distress.     Breath sounds: Normal breath sounds. No wheezing or rales.  Abdominal:     General: Bowel sounds are normal. There is no distension.     Palpations: Abdomen is soft.     Tenderness: There is no abdominal tenderness. There is no guarding or rebound.  Skin:    General: Skin is warm and dry.  Neurological:     Mental Status: She is alert.     Coordination: Coordination normal.      Dilation: Lip/rim Effacement (%): 100 Station: 0 Presentation: Vertex Exam by:: smith,cnm  FHT Baseline: 145 bpm Variability: Good {> 6 bpm) Accelerations: absent Decelerations: prolonged and multiple variables Uterine activity: regular q2 min Cat: II  Labs: Lab Results  Component Value Date   WBC 10.3 03/19/2024   HGB 14.2 03/19/2024   HCT 42.1 03/19/2024   MCV 85.9 03/19/2024   PLT 335 03/19/2024    Patient Active Problem List   Diagnosis Date Noted   Labor and delivery, indication for care 03/19/2024   Obesity affecting pregnancy, antepartum 10/25/2023   BMI 38.0-38.9,adult  09/05/2023   Supervision of high risk pregnancy, antepartum, third trimester 09/04/2023    Assessment / Plan: 33 y.o. G4P1021 at [redacted]w[redacted]d here for spontaneous labor and cat II tracing.  Labor: patient has been at 9.5 cm for about four hours at this point. Went to room with CNM and attempted to push but cervix not reducible and station remains at 0 despite pushing. In addition pushing provokes prolonged decels to the 80-90's.  Discussed with patient that at this point I am not optimistic about NSVD and station is still too high to attempt VAVD. I am also worried about the reserve that baby has to tolerate further labor as their is now meconium coming with each push. I recommended delivery by primary cesarean (patient had been previously consented). Patient declined at this time and would like to continue pushing as well as discussing recommendation for cesarean with her partner. Will continue to monitor tracing and reassess shortly.   Fetal Wellbeing:  Cat II tracing but now overall reassuring with moderate variability and accels Pain Control:  epidural, working well GBS: neg Anticipated MOD: less likely NSVD, likely cesarean   [name], MD/MPH Attending  Family Medicine Physician, [facility] for [facility], [facility]   03/19/2024, 12:05 PM

## 2024-03-19 NOTE — Anesthesia Preprocedure Evaluation (Signed)
Anesthesia Evaluation  Patient identified by MRN, date of birth, ID band Patient awake    Reviewed: Allergy & Precautions, H&P , NPO status , Patient's Chart, lab work & pertinent test results  Airway Mallampati: II  TM Distance: >3 FB Neck ROM: Full    Dental no notable dental hx.    Pulmonary neg pulmonary ROS   Pulmonary exam normal breath sounds clear to auscultation       Cardiovascular negative cardio ROS Normal cardiovascular exam Rhythm:Regular Rate:Normal     Neuro/Psych negative neurological ROS  negative psych ROS   GI/Hepatic negative GI ROS, Neg liver ROS,,,  Endo/Other  negative endocrine ROS    Renal/GU negative Renal ROS  negative genitourinary   Musculoskeletal negative musculoskeletal ROS (+)    Abdominal  (+) + obese  Peds negative pediatric ROS (+)  Hematology negative hematology ROS (+)   Anesthesia Other Findings   Reproductive/Obstetrics (+) Pregnancy                             Anesthesia Physical Anesthesia Plan  ASA: 3  Anesthesia Plan: Epidural   Post-op Pain Management:    Induction:   PONV Risk Score and Plan:   Airway Management Planned:   Additional Equipment:   Intra-op Plan:   Post-operative Plan:   Informed Consent:   Plan Discussed with:   Anesthesia Plan Comments:        Anesthesia Quick Evaluation  

## 2024-03-19 NOTE — H&P (Signed)
 Laurie Newman is a 33 y.o. female [phone] at [redacted]w[redacted]d  presenting for active labor and fetal heart rate decelerations.  US  MFM OB F/U on 01/30/24 at [redacted]w[redacted]d with FHR 141, cephalic presentation, posterior placenta, AFI wnl.   NURSING  PROVIDER  Office Location [facility] Dating by LMP c/w ultrasound @ [facility]  [facility] Model Traditional Anatomy U/S 7/16//2025  Initiated care at  11wks                Language  English              LAB RESULTS   Support Person Marqui Poteat or [employer] NIPS: LR/Female AFP:  not done    NT/IT (FT only)     Carrier Screen Horizon: declined  Rhogam  B/Positive/-- (05/13 1605) A1C/GTT Early HgbA1C: Normal Third trimester 2 hr GTT:  Glucose, Fasting 81  Glucose, 1 hour 143  Glucose, 2 hour 118    Flu Vaccine declined    TDaP Vaccine  01/14/2024 Blood Type B/Positive/-- (05/13 1605)  RSV Vaccine UTD 01/28/24 Antibody Negative (05/13 1605)  COVID Vaccine X1 dose Rubella 3.88 (05/13 1605)  Feeding Plan breast RPR Non Reactive (09/02 0837)  Contraception declines HBsAg Negative (05/13 1605)  Circumcision Yes, IP HIV Non Reactive (09/02 0837)  Pediatrician  [facility] HCVAb  Neg  Prenatal Classes Info given    BTL Consent  Pap Pt declined.  Will do PP.  BTL Pre-payment  GC/CT Initial:  Neg/Neg 36wks:  neg/neg  VBAC Consent  GBS Negative/-- (11/04 1647)Negative For PCN allergy, check sensitivities   BRx Optimized? [ ]  yes   [x]  no    DME Rx [x]  BP cuff [ ]  Weight Scale Waterbirth  [ ]  Class [ ]  Consent [ ]  CNM visit  PHQ9 & GAD7 [x]  new OB [  ] 28 weeks  [ x] 36 weeks Induction  [ ]  Orders Entered [ ] Foley Y/N     OB History     Gravida  4   Para  1   Term  1   Preterm  0   AB  2   Living  1      SAB  2   IAB  0   Ectopic  0   Multiple  0   Live Births  1          Past Medical History:  Diagnosis Date   History of prior pregnancy with IUGR newborn    Past Surgical History:  Procedure Laterality Date   NO PAST  SURGERIES     Family History: family history includes Asthma in her brother; Diabetes in her maternal grandmother; Hypertension in her maternal grandmother, mother, and sister. Social History:  reports that she has never smoked. She has never used smokeless tobacco. She reports that she does not currently use alcohol. She reports that she does not currently use drugs.     Maternal Diabetes: No Genetic Screening: Normal Maternal Ultrasounds/Referrals: Normal Fetal Ultrasounds or other Referrals:  None Maternal Substance Abuse:  No Significant Maternal Medications:  None Significant Maternal Lab Results:  Group B Strep negative Number of Prenatal Visits:greater than 3 verified prenatal visits Maternal Vaccinations:RSV: Given during pregnancy >/=14 days ago, TDap, and Flu Other Comments:  None  Review of Systems  Constitutional:  Negative for chills, fatigue and fever.  Eyes:  Negative for visual disturbance.  Respiratory:  Negative for shortness of breath.   Cardiovascular:  Negative for chest pain.  Gastrointestinal:  Positive for abdominal pain. Negative for vomiting.  Genitourinary:  Negative for difficulty urinating, dysuria, flank pain, pelvic pain, vaginal bleeding, vaginal discharge and vaginal pain.  Neurological:  Negative for dizziness and headaches.  Psychiatric/Behavioral: Negative.     Maternal Medical History:  Reason for admission: Contractions.  FHR deceleration  Contractions: Onset was 3-5 hours ago.   Perceived severity is moderate.   Fetal activity: Perceived fetal activity is normal.   Last perceived fetal movement was within the past hour.   Prenatal complications: no prenatal complications Prenatal Complications - Diabetes: none.   Dilation: 5 Effacement (%): 60 Station: -1 Exam by:: Benton Medley, RN Blood pressure 133/85, pulse (!) 117, temperature 98.2 F (36.8 C), temperature source Oral, resp. rate 17, height 5' 2 (1.575 m), weight 102.6 kg,  last menstrual period 06/17/2023, SpO2 100%, unknown if currently breastfeeding. Maternal Exam:  Uterine Assessment: Contraction strength is moderate.  Contraction frequency is regular.  Abdomen: Fetal presentation: vertex Cervix: Cervix evaluated by digital exam.     Fetal Exam Fetal Monitor Review: Mode: ultrasound.   Baseline rate: 135.  Variability: moderate (6-25 bpm).   Pattern: accelerations present and variable decelerations.   Fetal State Assessment: Category II - tracings are indeterminate.   Physical Exam Vitals and nursing note reviewed.  Constitutional:      Appearance: She is well-developed.  Cardiovascular:     Rate and Rhythm: Normal rate.     Heart sounds: Normal heart sounds.  Pulmonary:     Effort: Pulmonary effort is normal.     Breath sounds: Normal breath sounds.  Abdominal:     Palpations: Abdomen is soft.  Musculoskeletal:        General: Normal range of motion.     Cervical back: Normal range of motion.  Skin:    General: Skin is warm and dry.  Neurological:     Mental Status: She is alert and oriented to person, place, and time.  Psychiatric:        Behavior: Behavior normal.        Thought Content: Thought content normal.        Judgment: Judgment normal.     Prenatal labs: ABO, Rh: B/Positive/-- (05/13 1605) Antibody: Negative (05/13 1605) Rubella: 3.88 (05/13 1605) RPR: Non Reactive (09/02 0837)  HBsAg: Negative (05/13 1605)  HIV: Non Reactive (09/02 0837)  GBS: Negative/-- (11/04 1647)   Assessment/Plan: [id number]  at [redacted]w[redacted]d  admitted to L&D in early labor and for FHR decelerations, late and variable decelerations GBS negative  Admit to L&D Expectant management, augment PRN Pt plans to breastfeed and declines contraception She plans circumcision inpatient   Olam Boards 03/19/2024, 2:20 AM

## 2024-03-19 NOTE — Progress Notes (Signed)
 Laurie Newman is a 33 y.o. [phone] at [redacted]w[redacted]d.  Subjective: Mild pressure with contractions.   Objective: BP (!) 100/58   Pulse (!) 118   Temp 98 F (36.7 C) (Oral)   Resp 16   Ht 5' 2 (1.575 m)   Wt 102.6 kg   LMP 06/17/2023   SpO2 100%   BMI 41.37 kg/m    FHT:  FHR: 150 bpm, variability: mod,  accelerations:  10x10,  decelerations:  recurrent variable decels that improve in certain positions. UC:   Q 2-4 minutes, mod-strong Dilation: Reducible Lip/rim Effacement (%): 100 Station: 0 Presentation: Vertex Exam by:: Guiselle Mian,cnm  Labs: NA  Assessment / Plan: [redacted]w[redacted]d week IUP ROM x Hours: 7 Minutes: 0 Labor: Transition->2nd stage. Pt with reducible anterior lip,  Fetal Wellbeing:  Category II, concerning for recurrent variables but reassured by moderate variability.  Pain Control:  Epidural Anticipated MOD:  Uncertain. Concerned about fetal tolerance of labor and pushing.   Laurie Newman, Laurie Newman , CNM 03/19/2024 1:11 PM

## 2024-03-19 NOTE — Progress Notes (Signed)
 Labor Progress Note Laurie Newman is a 33 y.o. [phone] at [redacted]w[redacted]d presented for spontaneous labor.  S:  Comfortable with epidural  O:  BP 104/67   Pulse (!) 115   Temp 98.5 F (36.9 C) (Oral)   Resp 16   Ht 5' 2 (1.575 m)   Wt 102.6 kg   LMP 06/17/2023   SpO2 100%   BMI 41.37 kg/m   EFM: baseline 150 bpm/ minimal/moderate variability/ none accels/  variable decels  Toco/IUPC: regular SVE: Dilation: Lip/rim (lip will not reduce per cnm) Effacement (%): 100 Station: 0 Presentation: Vertex Exam by:: v smith,cnm   A/P: 33 y.o. [id number] [redacted]w[redacted]d  1. Labor: Anterior lip not reducible. Patient to right side. 2. FWB: Cat 2. Moderate variability. Responsive to interventions. 3. Pain: epidural   Anticipate cautious for NSVD.  [name] H [name], Student-MidWife 11:00 AM

## 2024-03-19 NOTE — Progress Notes (Signed)
 LABOR PROGRESS NOTE  Laurie Newman is a 33 y.o. [phone] at [redacted]w[redacted]d  admitted for spontaneous labor and Cat II tracing.  Subjective: Patients epidural working well  Objective: BP (!) 108/48   Pulse (!) 116   Temp 98.5 F (36.9 C) (Oral)   Resp 16   Ht 5' 2 (1.575 m)   Wt 102.6 kg   LMP 06/17/2023   SpO2 100%   BMI 41.37 kg/m  or  Vitals:   03/19/24 0737 03/19/24 0811 03/19/24 0832 03/19/24 0845  BP: 107/71 (!) 110/57 (!) 108/48   Pulse: (!) 109 (!) 115 (!) 116   Resp:    16  Temp:    98.5 F (36.9 C)  TempSrc:    Oral  SpO2:      Weight:      Height:        Physical Exam Vitals reviewed.  Constitutional:      General: She is not in acute distress.    Appearance: She is well-developed. She is not diaphoretic.  Cardiovascular:     Rate and Rhythm: Normal rate and regular rhythm.     Heart sounds: Normal heart sounds. No murmur heard. Pulmonary:     Effort: Pulmonary effort is normal. No respiratory distress.     Breath sounds: Normal breath sounds. No wheezing or rales.  Abdominal:     General: Bowel sounds are normal. There is no distension.     Palpations: Abdomen is soft.     Tenderness: There is no abdominal tenderness. There is no guarding or rebound.  Skin:    General: Skin is warm and dry.  Neurological:     Mental Status: She is alert.     Coordination: Coordination normal.      Dilation: Lip/rim Effacement (%): 100 Station: -1 Presentation: Vertex Exam by:: lee  FHT Baseline: 135 bpm Variability: Good {> 6 bpm) Accelerations: Reactive Decelerations: prolonged Uterine activity: regular q2 min Cat: II  Labs: Lab Results  Component Value Date   WBC 10.3 03/19/2024   HGB 14.2 03/19/2024   HCT 42.1 03/19/2024   MCV 85.9 03/19/2024   PLT 335 03/19/2024    Patient Active Problem List   Diagnosis Date Noted   Labor and delivery, indication for care 03/19/2024   Obesity affecting pregnancy, antepartum 10/25/2023   BMI 38.0-38.9,adult  09/05/2023   Supervision of high risk pregnancy, antepartum, third trimester 09/04/2023    Assessment / Plan: 33 y.o. G4P1021 at [redacted]w[redacted]d here for spontaneous labor and cat II tracing.  Labor: called to room due to prolonged decel. Examined patient, I considered operative delivery at that moment but her exam was still with a rim of cervix and -1, too high. FSE was placed by CNM and heart rate recovered. During this period I spoke with patient about nature of tracing. As FHR had recovered I still consider it safe to proceed with attempt for vaginal delivery, but we did discuss possibility of urgent/emergent cesarean as well as operative vaginal delivery with vacuum, and she was verbally consented for both. For now continue position changes, pitocin  PRN if needed, suspect some malpositioning/OP presentation of baby.  The risks of cesarean section discussed with the patient included but were not limited to: bleeding which may require transfusion or reoperation; infection which may require antibiotics; injury to bowel, bladder, ureters or other surrounding organs; injury to the fetus; need for additional procedures including hysterectomy in the event of a life-threatening hemorrhage; placental abnormalities with subsequent pregnancies, incisional problems,  thromboembolic phenomenon and other postoperative/anesthesia complications.   Risks and benefits of operative vaginal delivery with vacuum discussed in detail.  Risks include, but are not limited to the risks of anesthesia, bleeding, infection, damage to maternal tissues, fetal cephalhematoma.  There is also the risk of inability to effect vaginal delivery of the head, or shoulder dystocia that cannot be resolved by established maneuvers, leading to the need for emergency cesarean section.   Fetal Wellbeing:  Cat II tracing but now overall reassuring with moderate variability and accels Pain Control:  epidural, working well GBS: neg Anticipated MOD:   cautious for NSVD   Laurie CHRISTELLA Carolus, MD/MPH Attending Family Medicine Physician, [facility] for [facility], [facility]   03/19/2024, 9:21 AM

## 2024-03-19 NOTE — Anesthesia Postprocedure Evaluation (Signed)
 Anesthesia Post Note  Patient: Laurie Newman  Procedure(s) Performed: CESAREAN DELIVERY     Patient location during evaluation: PACU Anesthesia Type: Epidural Level of consciousness: awake Pain management: pain level controlled Vital Signs Assessment: post-procedure vital signs reviewed and stable Respiratory status: spontaneous breathing, nonlabored ventilation and respiratory function stable Cardiovascular status: stable Postop Assessment: no headache, no backache and epidural receding Anesthetic complications: no   No notable events documented.  Last Vitals:  Vitals:   03/19/24 1700 03/19/24 1715  BP: 130/74 129/89  Pulse: 100 (!) 103  Resp: 18 17  Temp: 36.6 C   SpO2: 98% 98%    Last Pain:  Vitals:   03/19/24 1708  TempSrc:   PainSc: 0-No pain    LLE Motor Response: Purposeful movement (03/19/24 1715) LLE Sensation: Increased (03/19/24 1715) RLE Motor Response: Purposeful movement (03/19/24 1715) RLE Sensation: Increased (03/19/24 1715)      Delon Aisha Arch

## 2024-03-19 NOTE — Anesthesia Procedure Notes (Signed)
 Epidural Patient location during procedure: OB Start time: 03/19/2024 4:23 AM End time: 03/19/2024 4:45 AM  Staffing Anesthesiologist: Cleotilde Butler Dade, MD Performed: anesthesiologist   Preanesthetic Checklist Completed: patient identified, IV checked, site marked, risks and benefits discussed, surgical consent, monitors and equipment checked, pre-op evaluation and timeout performed  Epidural Patient position: sitting Prep: ChloraPrep Patient monitoring: heart rate, cardiac monitor, continuous pulse ox and blood pressure Approach: midline Location: L2-L3 Injection technique: LOR saline  Needle:  Needle type: Tuohy  Needle gauge: 17 G Needle length: 9 cm Needle insertion depth: 8 cm Catheter type: closed end flexible Catheter size: 20 Guage Catheter at skin depth: 13 cm Test dose: negative  Assessment Events: blood not aspirated, injection not painful, no injection resistance, no paresthesia and negative IV test  Additional Notes Reason for block:procedure for pain

## 2024-03-20 ENCOUNTER — Encounter (HOSPITAL_COMMUNITY): Payer: Self-pay | Admitting: Family Medicine

## 2024-03-20 LAB — CBC
HCT: 36.6 % (ref 36.0–46.0)
Hemoglobin: 12.5 g/dL (ref 12.0–15.0)
MCH: 29.1 pg (ref 26.0–34.0)
MCHC: 34.2 g/dL (ref 30.0–36.0)
MCV: 85.1 fL (ref 80.0–100.0)
Platelets: 267 K/uL (ref 150–400)
RBC: 4.3 MIL/uL (ref 3.87–5.11)
RDW: 15 % (ref 11.5–15.5)
WBC: 19.1 K/uL — ABNORMAL HIGH (ref 4.0–10.5)
nRBC: 0 % (ref 0.0–0.2)

## 2024-03-20 MED ORDER — ACETAMINOPHEN 500 MG PO TABS
1000.0000 mg | ORAL_TABLET | Freq: Four times a day (QID) | ORAL | Status: DC
Start: 2024-03-20 — End: 2024-03-21
  Administered 2024-03-20: 1000 mg via ORAL
  Filled 2024-03-20 (×4): qty 2

## 2024-03-20 NOTE — Progress Notes (Signed)
 POSTPARTUM PROGRESS NOTE  Subjective: 33 y.o. [id number] who is POD1 s/p primary cesarean section  Course complicated by: none  Doing well. Pain controlled. Ambulating without issue. No significant bleeding.  Objective: Blood pressure 112/73, pulse (!) 111, temperature 98.2 F (36.8 C), temperature source Oral, resp. rate 18, height 5' 2 (1.575 m), weight 102.6 kg, last menstrual period 06/17/2023, SpO2 100%, unknown if currently breastfeeding.  Physical Exam:  General: alert, cooperative, and no distress Abdomen: soft, nontender nondistended Uterine Fundus: firm Lochia: appropriate Incision: bandage in place, no staining Lower extremities: No significant calf/ankle edema.  Recent Labs    03/19/24 0216 03/20/24 0534  HGB 14.2 12.5  HCT 42.1 36.6    Assessment/Plan: Postpartum: advance milestones, encourage ambulation - Contraception: to be discussed - MOF: breast/formula - Rh status: Rh pos - Rubella status: immune - Consults: lactation  Neonatal - Doing well - Circumcision: plans   LOS: 1 day    Rollo ONEIDA Bring, MD, FACOG Obstetrician & Gynecologist, [facility] for [facility], [facility] 03/20/2024, 12:10 PM

## 2024-03-20 NOTE — Lactation Note (Signed)
 This note was copied from a baby's chart. Lactation Consultation Note  Patient Name: Laurie Newman Unijb'd Date: 03/20/2024 Age:33 hours Reason for consult: Follow-up assessment;Primapara;1st time breastfeeding;Term;Exclusive pumping and bottle feeding  P2- MOB plans to exclusively pump and formula feed. No latching infant to the breast. MOB reports that she has not started pumping since LC set up the pump last night. MOB asked if it was too late to start. LC reassured MOB that it is not too late to start pumping. LC reviewed the difference between colostrum vs mature milk. LC also reviewed the importance of consistent breast stimulation. MOB requested for LC to show her hand expression so she could ensure there is colostrum in her breasts. With permission, LC expressed the right breast twice. A small drop of colostrum was noted. MOB reported feeling excited. LC encouraged MOB to start pumping every 3 hrs for the breast stimulation. LC also encouraged MOB to call for assistance as needed.  Maternal Data Has patient been taught Hand Expression?: Yes Does the patient have breastfeeding experience prior to this delivery?: No  Feeding Mother's Current Feeding Choice: Breast Milk and Formula  Lactation Tools Discussed/Used Tools: Pump;Flanges Breast pump type: Manual;Double-Electric Breast Pump Pump Education: Setup, frequency, and cleaning;Milk Storage Reason for Pumping: exclusive pumping Pumping frequency: 15-20 min every 3 hrs  Interventions Interventions: Breast feeding basics reviewed;Hand express;Hand pump;DEBP;Education;LC Services brochure  Discharge Discharge Education: Engorgement and breast care;Warning signs for feeding baby Pump: DEBP;Personal  Consult Status Consult Status: Follow-up Date: 03/21/24 Follow-up type: In-patient    Recardo Hoit BS, IBCLC 03/20/2024, 10:20 PM

## 2024-03-21 ENCOUNTER — Other Ambulatory Visit (HOSPITAL_COMMUNITY): Payer: Self-pay

## 2024-03-21 MED ORDER — ACETAMINOPHEN 500 MG PO TABS
1000.0000 mg | ORAL_TABLET | Freq: Four times a day (QID) | ORAL | 0 refills | Status: AC
  Filled 2024-03-21: qty 60, 8d supply, fill #0

## 2024-03-21 MED ORDER — HYDROCODONE-ACETAMINOPHEN 5-325 MG PO TABS
1.0000 | ORAL_TABLET | ORAL | 0 refills | Status: AC | PRN
  Filled 2024-03-21: qty 20, 2d supply, fill #0

## 2024-03-21 MED ORDER — SENNOSIDES-DOCUSATE SODIUM 8.6-50 MG PO TABS
2.0000 | ORAL_TABLET | Freq: Every evening | ORAL | 0 refills | Status: AC | PRN
  Filled 2024-03-21: qty 60, 30d supply, fill #0

## 2024-03-21 MED ORDER — IBUPROFEN 600 MG PO TABS
600.0000 mg | ORAL_TABLET | Freq: Four times a day (QID) | ORAL | 0 refills | Status: AC
  Filled 2024-03-21: qty 30, 8d supply, fill #0

## 2024-03-21 NOTE — Patient Instructions (Signed)
 Your appointment with Outpatient Lactation is: Date:12/18 Time:11:30am [facility] (First Floor) [address] [place]  Check in under baby's name.  Please bring your baby hungry along with your pump and a bottle of either formula or expressed breast milk. Please also bring your pump flanges and we welcome support people! If you need lactation assistance before your appointment, please call [phone] for lactation voice mail.  -- [facility] Lact[facility]  Please join us  for our [facility] [employer] Lactation Support Group at [employer] We meet every Tuesday at 10:00 am to 12:00 pm at [employer] on the second floor in the conference room Lactating parents and lap babies are welcome, no registration is required, if you have a lactation pillow please bring

## 2024-03-24 ENCOUNTER — Encounter (HOSPITAL_BASED_OUTPATIENT_CLINIC_OR_DEPARTMENT_OTHER): Admitting: Certified Nurse Midwife

## 2024-03-25 LAB — SURGICAL PATHOLOGY

## 2024-03-28 ENCOUNTER — Encounter (HOSPITAL_BASED_OUTPATIENT_CLINIC_OR_DEPARTMENT_OTHER): Payer: Self-pay

## 2024-03-31 ENCOUNTER — Encounter (HOSPITAL_BASED_OUTPATIENT_CLINIC_OR_DEPARTMENT_OTHER): Payer: Self-pay

## 2024-03-31 ENCOUNTER — Ambulatory Visit (HOSPITAL_BASED_OUTPATIENT_CLINIC_OR_DEPARTMENT_OTHER)

## 2024-03-31 NOTE — Progress Notes (Signed)
 Subjective:  Laurie Newman is a 33 y.o. female here for BP check and incision check.  Hypertension ROS: Patient denies any blurry vision, headache, numbness, tingling or right upper quadrant pain. Patient does report that she had swelling of the right foot that has since decreased.  Pt reports incision covered. Honeycomb dressing removed, clean dry and intact. Incision intact, clean and dry.   Objective:  LMP 06/17/2023   Appearance alert, well appearing, and in no distress and oriented to person, place, and time. Incision healing well, no significant drainage, no dehiscence, no significant erythema.  Assessment:   Blood Pressure today in office well controlled and stable.  Incision healed nicely, no signs of infection, scar looks good  Plan:  Follow up in office for postpartum appointment on 04/15/2024.  Morna LOISE Quale, RN

## 2024-04-15 ENCOUNTER — Ambulatory Visit (INDEPENDENT_AMBULATORY_CARE_PROVIDER_SITE_OTHER): Admitting: Certified Nurse Midwife

## 2024-04-15 ENCOUNTER — Encounter (HOSPITAL_BASED_OUTPATIENT_CLINIC_OR_DEPARTMENT_OTHER): Payer: Self-pay | Admitting: Certified Nurse Midwife

## 2024-04-15 DIAGNOSIS — Z1332 Encounter for screening for maternal depression: Secondary | ICD-10-CM

## 2024-04-15 NOTE — Progress Notes (Signed)
 Subjective:     Laurie Newman is a 33 y.o. female who presents for a postpartum visit. She is 3 weeks postpartum following a Primary LTCS. I have fully reviewed the prenatal and intrapartum course. The delivery was at 39 gestational weeks. Anesthesia: epidural. Postpartum course has been uneventful. Baby's course has been uneventful. Baby is feeding by Similac. Bleeding no bleeding. Bowel function is normal. Bladder function is normal. Patient is sexually active. Contraception method is condoms. Postpartum depression screening: negative.  The following portions of the patient's history were reviewed and updated as appropriate: allergies, current medications, past family history, past medical history, past social history, past surgical history, and problem list.  Review of Systems Pertinent items are noted in HPI.   Objective:    BP 117/78   Pulse 90   Wt 212 lb 9.6 oz (96.4 kg)   LMP 06/17/2023   Breastfeeding No   BMI 38.89 kg/m   General:  alert, cooperative, and appears stated age   Breasts:  inspection negative, no nipple discharge or bleeding, no masses or nodularity palpable  Lungs: clear to auscultation bilaterally  Heart:  regular rate and rhythm, S1, S2 normal, no murmur, click, rub or gallop  Abdomen: Incision healed but there is a small (2mm) area at right edge of incision that may be a small area of granulomatous scar tissue   Vulva:    Vagina:   Cervix:    Corpus:   Adnexa:    Rectal Exam:         Assessment:    [id number] Primary LTCS here for routine  postpartum exam.  Bottlefeeding PP Depression Screen Negative  Plan:    1. Contraception: condoms 2. Pt will return to office in 1 month for pap smear (routine) and re-evaluation of scar tissue at right edge of abdominal (CS) incision. If granulomatous scar tissue present, plan silver nitrate to this area. 3. Follow up in: 1 month for pap smear and recheck of incision/treat with Silver Nitrate if needed at that  time.  Arland MARLA Roller

## 2024-05-29 ENCOUNTER — Ambulatory Visit (HOSPITAL_BASED_OUTPATIENT_CLINIC_OR_DEPARTMENT_OTHER): Payer: Self-pay | Admitting: Obstetrics and Gynecology
